# Patient Record
Sex: Female | Born: 1950 | Race: White | Hispanic: No | Smoking: Never smoker
Health system: Southern US, Community
[De-identification: ages and names within clinical notes are randomized; demographics above are authoritative.]

## PROBLEM LIST (undated history)

## (undated) DIAGNOSIS — K852 Alcohol induced acute pancreatitis without necrosis or infection: Secondary | ICD-10-CM

## (undated) DIAGNOSIS — R569 Unspecified convulsions: Secondary | ICD-10-CM

## (undated) DIAGNOSIS — I7300 Raynaud's syndrome without gangrene: Secondary | ICD-10-CM

## (undated) DIAGNOSIS — F329 Major depressive disorder, single episode, unspecified: Secondary | ICD-10-CM

## (undated) DIAGNOSIS — B182 Chronic viral hepatitis C: Secondary | ICD-10-CM

## (undated) DIAGNOSIS — D649 Anemia, unspecified: Secondary | ICD-10-CM

## (undated) DIAGNOSIS — F1010 Alcohol abuse, uncomplicated: Secondary | ICD-10-CM

## (undated) DIAGNOSIS — Z87898 Personal history of other specified conditions: Secondary | ICD-10-CM

## (undated) DIAGNOSIS — E162 Hypoglycemia, unspecified: Secondary | ICD-10-CM

## (undated) DIAGNOSIS — E871 Hypo-osmolality and hyponatremia: Secondary | ICD-10-CM

## (undated) DIAGNOSIS — F32A Depression, unspecified: Secondary | ICD-10-CM

## (undated) HISTORY — DX: Unspecified convulsions: R56.9

## (undated) HISTORY — PX: TUBAL LIGATION: SHX77

## (undated) HISTORY — PX: RHINOPLASTY: SUR1284

## (undated) HISTORY — DX: Raynaud's syndrome without gangrene: I73.00

## (undated) HISTORY — DX: Chronic viral hepatitis C: B18.2

---

## 2008-10-19 ENCOUNTER — Emergency Department (HOSPITAL_COMMUNITY): Admission: RE | Admit: 2008-10-19 | Discharge: 2008-10-19 | Payer: Self-pay | Admitting: Emergency Medicine

## 2012-05-12 ENCOUNTER — Inpatient Hospital Stay (HOSPITAL_COMMUNITY)
Admission: EM | Admit: 2012-05-12 | Discharge: 2012-05-15 | DRG: 439 | Disposition: A | Discharge status: Home / Self Care | Payer: MEDICAID | Attending: Internal Medicine | Admitting: Internal Medicine

## 2012-05-12 ENCOUNTER — Encounter (HOSPITAL_COMMUNITY): Payer: Self-pay | Admitting: *Deleted

## 2012-05-12 ENCOUNTER — Emergency Department (HOSPITAL_COMMUNITY): Payer: Self-pay

## 2012-05-12 DIAGNOSIS — E876 Hypokalemia: Secondary | ICD-10-CM | POA: Diagnosis present

## 2012-05-12 DIAGNOSIS — R1013 Epigastric pain: Secondary | ICD-10-CM | POA: Diagnosis present

## 2012-05-12 DIAGNOSIS — Z87898 Personal history of other specified conditions: Secondary | ICD-10-CM

## 2012-05-12 DIAGNOSIS — R531 Weakness: Secondary | ICD-10-CM

## 2012-05-12 DIAGNOSIS — K859 Acute pancreatitis without necrosis or infection, unspecified: Principal | ICD-10-CM | POA: Diagnosis present

## 2012-05-12 DIAGNOSIS — F1020 Alcohol dependence, uncomplicated: Secondary | ICD-10-CM | POA: Diagnosis present

## 2012-05-12 DIAGNOSIS — K852 Alcohol induced acute pancreatitis without necrosis or infection: Secondary | ICD-10-CM | POA: Diagnosis present

## 2012-05-12 DIAGNOSIS — D649 Anemia, unspecified: Secondary | ICD-10-CM | POA: Diagnosis present

## 2012-05-12 DIAGNOSIS — K5289 Other specified noninfective gastroenteritis and colitis: Secondary | ICD-10-CM | POA: Diagnosis present

## 2012-05-12 DIAGNOSIS — D539 Nutritional anemia, unspecified: Secondary | ICD-10-CM | POA: Diagnosis present

## 2012-05-12 DIAGNOSIS — F1010 Alcohol abuse, uncomplicated: Secondary | ICD-10-CM | POA: Diagnosis present

## 2012-05-12 DIAGNOSIS — R569 Unspecified convulsions: Secondary | ICD-10-CM | POA: Diagnosis present

## 2012-05-12 DIAGNOSIS — J329 Chronic sinusitis, unspecified: Secondary | ICD-10-CM | POA: Diagnosis present

## 2012-05-12 DIAGNOSIS — K529 Noninfective gastroenteritis and colitis, unspecified: Secondary | ICD-10-CM

## 2012-05-12 DIAGNOSIS — F3289 Other specified depressive episodes: Secondary | ICD-10-CM | POA: Diagnosis present

## 2012-05-12 DIAGNOSIS — Z8669 Personal history of other diseases of the nervous system and sense organs: Secondary | ICD-10-CM

## 2012-05-12 DIAGNOSIS — R112 Nausea with vomiting, unspecified: Secondary | ICD-10-CM | POA: Diagnosis present

## 2012-05-12 DIAGNOSIS — E871 Hypo-osmolality and hyponatremia: Secondary | ICD-10-CM | POA: Diagnosis present

## 2012-05-12 DIAGNOSIS — R197 Diarrhea, unspecified: Secondary | ICD-10-CM | POA: Diagnosis present

## 2012-05-12 DIAGNOSIS — F329 Major depressive disorder, single episode, unspecified: Secondary | ICD-10-CM | POA: Diagnosis present

## 2012-05-12 DIAGNOSIS — Z8659 Personal history of other mental and behavioral disorders: Secondary | ICD-10-CM

## 2012-05-12 DIAGNOSIS — R627 Adult failure to thrive: Secondary | ICD-10-CM | POA: Diagnosis present

## 2012-05-12 HISTORY — DX: Major depressive disorder, single episode, unspecified: F32.9

## 2012-05-12 HISTORY — DX: Anemia, unspecified: D64.9

## 2012-05-12 HISTORY — DX: Alcohol abuse, uncomplicated: F10.10

## 2012-05-12 HISTORY — DX: Alcohol induced acute pancreatitis without necrosis or infection: K85.20

## 2012-05-12 HISTORY — DX: Hypoglycemia, unspecified: E16.2

## 2012-05-12 HISTORY — DX: Depression, unspecified: F32.A

## 2012-05-12 HISTORY — DX: Hypo-osmolality and hyponatremia: E87.1

## 2012-05-12 HISTORY — DX: Personal history of other specified conditions: Z87.898

## 2012-05-12 LAB — CBC
HCT: 34.6 % — ABNORMAL LOW (ref 36.0–46.0)
Hemoglobin: 12.2 g/dL (ref 12.0–15.0)
MCHC: 35.3 g/dL (ref 30.0–36.0)
RBC: 3.51 MIL/uL — ABNORMAL LOW (ref 3.87–5.11)

## 2012-05-12 LAB — COMPREHENSIVE METABOLIC PANEL
ALT: 33 U/L (ref 0–35)
Alkaline Phosphatase: 73 U/L (ref 39–117)
BUN: 7 mg/dL (ref 6–23)
CO2: 33 mEq/L — ABNORMAL HIGH (ref 19–32)
GFR calc Af Amer: >90 mL/min (ref >90)
GFR calc non Af Amer: >90 mL/min (ref >90)
Glucose, Bld: 116 mg/dL — ABNORMAL HIGH (ref 70–99)
Potassium: 3.2 mEq/L — ABNORMAL LOW (ref 3.5–5.1)
Total Bilirubin: 0.5 mg/dL (ref 0.3–1.2)
Total Protein: 7.7 g/dL (ref 6.0–8.3)

## 2012-05-12 LAB — URINALYSIS, ROUTINE W REFLEX MICROSCOPIC
Bilirubin Urine: NEGATIVE
Glucose, UA: NEGATIVE mg/dL
Ketones, ur: NEGATIVE mg/dL
Specific Gravity, Urine: 1.01 (ref 1.005–1.030)
pH: 6 (ref 5.0–8.0)

## 2012-05-12 LAB — RAPID URINE DRUG SCREEN, HOSP PERFORMED
Benzodiazepines: NOT DETECTED
Cocaine: NOT DETECTED

## 2012-05-12 LAB — URINE MICROSCOPIC-ADD ON

## 2012-05-12 MED ORDER — SODIUM CHLORIDE 0.9 % IV SOLN: Freq: Once | INTRAVENOUS | Status: DC

## 2012-05-12 MED ORDER — SODIUM CHLORIDE 0.9 % IV BOLUS (SEPSIS)
1000.0000 mL | INTRAVENOUS | Status: AC
  Administered 2012-05-13: 1000 mL via INTRAVENOUS

## 2012-05-12 NOTE — ED Provider Notes (Signed)
History   This chart was scribed for Tobin Chad, MD by Toya Smothers. The patient was seen in room APA09/APA09. Patient's care was started at 2014.  CSN: [id number]  Arrival date & time 05/12/12  2014   First MD Initiated Contact with Patient 05/12/12 2134      Chief Complaint  Patient presents with  . Medical Clearance   The history is provided by the patient. No language interpreter was used.    Maria Petty is a 61 y.o. female w/ a h/o seizure ('03) who was brought to the Emergency Department by law enforcement after her house was deemed uninhabitable by adult protective services today. Typically healthy at baseline, Pt reports that she had a seizure 12 days ago after which she became bed ridden with nausea, vomiting, and diarrhea for 4 days. She denotes that she was unable to take care of 10 her dogs and they started to scavenge throughout the house. Law enforcement reports that floors were covered with feces and flee ridden. Pt presents asymptomatically, awake, alert, and oriented. She is currently unemployed. 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  Past Medical History  Diagnosis Date  . Hypoglycemia     Past Surgical History  Procedure Date  . Rhinoplasty   . Tubal ligation     History reviewed. No pertinent family history.  History  Substance Use Topics  . Smoking status: Never Smoker   . Smokeless tobacco: Not on file  . Alcohol Use: Yes   Review of Systems  Constitutional: Positive for activity change and appetite change. Negative for fever, chills, diaphoresis and fatigue.  HENT: Negative.   Eyes: Negative for visual disturbance.  Respiratory: Negative for apnea, cough, chest tightness, shortness of breath and wheezing.   Cardiovascular: Negative for chest pain, palpitations and leg swelling.  Gastrointestinal: Negative for vomiting, abdominal pain, diarrhea, constipation and abdominal  distention.  Genitourinary: Negative for dysuria, hematuria, flank pain, decreased urine volume and difficulty urinating.  Musculoskeletal: Negative for myalgias, back pain, joint swelling and arthralgias.  Skin: Positive for wound. Negative for color change and rash.  Neurological: Positive for seizures and weakness. Negative for dizziness, tremors, syncope, facial asymmetry, speech difficulty, light-headedness, numbness and headaches.  Psychiatric/Behavioral: Negative for suicidal ideas, hallucinations, confusion, disturbed wake/sleep cycle, self-injury, dysphoric mood, decreased concentration and agitation. The patient is not nervous/anxious.   All other systems reviewed and are negative.    Allergies  Review of patient's allergies indicates no known allergies.  Home Medications  No current outpatient prescriptions on file.  BP 147/90  Pulse 92  Temp 98.3 F (36.8 C) (Oral)  Resp 20  Ht 5\' 6"  (1.676 m)  Wt 122 lb 3 oz (55.424 kg)  BMI 19.72 kg/m2  SpO2 100%  Physical Exam  Nursing note and vitals reviewed. Constitutional: She is oriented to person, place, and time. She appears well-developed and well-nourished. No distress.       Pt appears thin but not cachectic.  HENT:  Head: Normocephalic.  Right Ear: External ear normal.  Left Ear: External ear normal.  Nose: Nose normal.  Mouth/Throat: Oropharynx is clear and moist. No oropharyngeal exudate.  Eyes: Conjunctivae normal and EOM are normal. Pupils are equal, round, and reactive to light. Right eye exhibits no discharge. Left eye exhibits no discharge. No scleral icterus.  Neck: Normal range of motion. Neck supple. No JVD present. No tracheal deviation present. No thyromegaly present.  Cardiovascular: Normal rate, regular rhythm, normal heart sounds and intact distal pulses.  Exam reveals no gallop and no friction rub.   No murmur heard. Pulmonary/Chest: Effort normal and breath sounds normal. No stridor. No respiratory  distress. She has no wheezes. She has no rales. She exhibits no tenderness.  Abdominal: Soft. Bowel sounds are normal. She exhibits no distension and no mass. There is no tenderness. There is no rebound and no guarding.  Musculoskeletal: Normal range of motion. She exhibits no edema and no tenderness.  Lymphadenopathy:    She has no cervical adenopathy.  Neurological: She is alert and oriented to person, place, and time. No cranial nerve deficit. Coordination normal.  Skin: Skin is warm and dry. No rash noted. She is not diaphoretic. No erythema. No pallor.       Large abrasion with well formed scab over right elbow.  Area is nontender and without erythema.  Multiple small abrasions noted on arms.  Psychiatric: She has a normal mood and affect. Her behavior is normal. Thought content normal. Her mood appears not anxious. Her affect is not angry, not blunt, not labile and not inappropriate. Her speech is not rapid and/or pressured, not delayed, not tangential and not slurred. She is not agitated, not aggressive, is not hyperactive, not slowed, not withdrawn, not actively hallucinating and not combative. Thought content is not paranoid and not delusional. Cognition and memory are normal. Cognition and memory are not impaired. She does not exhibit a depressed mood. She expresses no homicidal and no suicidal ideation. She expresses no suicidal plans and no homicidal plans. She is communicative. She exhibits normal recent memory and normal remote memory.       Pt is pleasant and interactive.  She demonstrates poor insight as to why she is in the ER. She is attentive.    ED Course  Procedures (including critical care time) DIAGNOSTIC STUDIES: Oxygen Saturation is 100% on room air, normal by my interpretation.    COORDINATION OF CARE: 21:53- Evaluated PT. Pt is awake, alert, and oriented.   Labs Reviewed  URINALYSIS, ROUTINE W REFLEX MICROSCOPIC - Abnormal; Notable for the following:    Leukocytes,  UA SMALL (*)     All other components within normal limits  CBC - Abnormal; Notable for the following:    RBC 3.51 (*)     HCT 34.6 (*)     MCH 34.8 (*)     All other components within normal limits  COMPREHENSIVE METABOLIC PANEL - Abnormal; Notable for the following:    Sodium 122 (*)     Potassium 3.2 (*)     Chloride 77 (*)     CO2 33 (*)     Glucose, Bld 116 (*)     All other components within normal limits  URINE RAPID DRUG SCREEN (HOSP PERFORMED)  URINE MICROSCOPIC-ADD ON   Ct Head Wo Contrast  05/12/2012  *RADIOLOGY REPORT*  Clinical Data: 61 year old female with altered level of consciousness and seizure.  CT HEAD WITHOUT CONTRAST  Technique:  Contiguous axial images were obtained from the base of the skull through the vertex without contrast.  Comparison: None  Findings: Mild chronic small vessel white matter ischemic changes are noted.  No acute intracranial abnormalities are identified, including mass lesion or mass effect, hydrocephalus, extra-axial fluid collection, midline shift, hemorrhage, or acute infarction.  The visualized bony calvarium is unremarkable. Opacified right to anterior ethmoid air cells and right frontal sinus noted. Mild mucosal thickening within the right maxillary  sinus identified.  IMPRESSION: No evidence of acute intracranial abnormality.  Mild chronic small vessel white matter ischemic changes.  Right paranasal sinusitis.   Original Report Authenticated By: Rosendo Gros, M.D.      No diagnosis found.   Date: 05/12/2012  Rate: 92 bpm  Rhythm: normal sinus rhythm  QRS Axis: left  Intervals: normal  ST/T Wave abnormalities: normal  Conduction Disutrbances:none  Narrative Interpretation: note both inf and septal Q waves as well as poor R progression.  Old EKG Reviewed: none available      MDM  Pt presents under IVC for evaluation after she was found in a home that appeared disheveled, full of unkempt and emaciated dogs, and insect infested.   She reports a recent illness that began with a seizure followed by several days of nausea and vomiting that prevented her from getting out of bed and tending to her animals (10 dogs).  She denies SI/HI but does not seem to have any insight as to why she is here in the ER or why her home is now not fit for habitation.  Plan routine labs, CT of head, and psych/soc service eval.    0000.  Note significant hyponatremia on BMP.  Pt denies any hx of same.  CT of head is negative for CVA or ICH but there is evidence of a right paranasal sinusitis.  Pt reports a long hx of sinus dx.  Secondary to hyponatremia, plan contact the hospitalist for further evaluation and observation admission.   I personally performed the services described in this documentation, which was scribed in my presence. The recorded information has been reviewed and considered.       Tobin Chad, MD 05/13/12 [phone]

## 2012-05-12 NOTE — ED Notes (Signed)
Patient transported to CT with sitter.

## 2012-05-12 NOTE — ED Notes (Signed)
Pt back from ct

## 2012-05-12 NOTE — ED Notes (Signed)
Here with deputy, in cuffs with IVC papers. Alert, cooperative,  Pt says she had been ill and her dogs mess up her house.  Papers say home is  Uninhabitable.  Pt alert , cooperative at triage.

## 2012-05-13 ENCOUNTER — Inpatient Hospital Stay (HOSPITAL_COMMUNITY): Payer: Self-pay

## 2012-05-13 ENCOUNTER — Encounter (HOSPITAL_COMMUNITY): Payer: Self-pay | Admitting: *Deleted

## 2012-05-13 DIAGNOSIS — R112 Nausea with vomiting, unspecified: Secondary | ICD-10-CM

## 2012-05-13 DIAGNOSIS — R1013 Epigastric pain: Secondary | ICD-10-CM

## 2012-05-13 DIAGNOSIS — K852 Alcohol induced acute pancreatitis without necrosis or infection: Secondary | ICD-10-CM

## 2012-05-13 DIAGNOSIS — R197 Diarrhea, unspecified: Secondary | ICD-10-CM

## 2012-05-13 DIAGNOSIS — R531 Weakness: Secondary | ICD-10-CM | POA: Diagnosis present

## 2012-05-13 DIAGNOSIS — F1010 Alcohol abuse, uncomplicated: Secondary | ICD-10-CM

## 2012-05-13 DIAGNOSIS — Z8659 Personal history of other mental and behavioral disorders: Secondary | ICD-10-CM

## 2012-05-13 DIAGNOSIS — E871 Hypo-osmolality and hyponatremia: Secondary | ICD-10-CM

## 2012-05-13 DIAGNOSIS — D649 Anemia, unspecified: Secondary | ICD-10-CM

## 2012-05-13 DIAGNOSIS — E876 Hypokalemia: Secondary | ICD-10-CM | POA: Diagnosis present

## 2012-05-13 DIAGNOSIS — Z87898 Personal history of other specified conditions: Secondary | ICD-10-CM

## 2012-05-13 DIAGNOSIS — K5289 Other specified noninfective gastroenteritis and colitis: Secondary | ICD-10-CM

## 2012-05-13 DIAGNOSIS — K529 Noninfective gastroenteritis and colitis, unspecified: Secondary | ICD-10-CM | POA: Diagnosis present

## 2012-05-13 DIAGNOSIS — R569 Unspecified convulsions: Secondary | ICD-10-CM

## 2012-05-13 HISTORY — DX: Alcohol induced acute pancreatitis without necrosis or infection: K85.20

## 2012-05-13 HISTORY — DX: Alcohol abuse, uncomplicated: F10.10

## 2012-05-13 HISTORY — DX: Anemia, unspecified: D64.9

## 2012-05-13 HISTORY — DX: Personal history of other specified conditions: Z87.898

## 2012-05-13 HISTORY — DX: Hypo-osmolality and hyponatremia: E87.1

## 2012-05-13 LAB — COMPREHENSIVE METABOLIC PANEL
BUN: 5 mg/dL — ABNORMAL LOW (ref 6–23)
CO2: 32 mEq/L (ref 19–32)
Calcium: 8.7 mg/dL (ref 8.4–10.5)
Creatinine, Ser: 0.48 mg/dL — ABNORMAL LOW (ref 0.50–1.10)
GFR calc Af Amer: >90 mL/min (ref >90)
GFR calc non Af Amer: >90 mL/min (ref >90)
Glucose, Bld: 142 mg/dL — ABNORMAL HIGH (ref 70–99)

## 2012-05-13 LAB — CBC
Hemoglobin: 11.4 g/dL — ABNORMAL LOW (ref 12.0–15.0)
MCHC: 35.4 g/dL (ref 30.0–36.0)
RBC: 3.27 MIL/uL — ABNORMAL LOW (ref 3.87–5.11)
WBC: 4.8 10*3/uL (ref 4.0–10.5)

## 2012-05-13 LAB — ETHANOL: Alcohol, Ethyl (B): <11 mg/dL (ref 0–11)

## 2012-05-13 LAB — TSH: TSH: 1.162 u[IU]/mL (ref 0.350–4.500)

## 2012-05-13 LAB — MAGNESIUM: Magnesium: 1.6 mg/dL (ref 1.5–2.5)

## 2012-05-13 LAB — VITAMIN B12: Vitamin B-12: 656 pg/mL (ref 211–911)

## 2012-05-13 LAB — LIPASE, BLOOD
Lipase: 180 U/L — ABNORMAL HIGH (ref 11–59)
Lipase: 161 U/L — ABNORMAL HIGH (ref 11–59)

## 2012-05-13 MED ORDER — SODIUM CHLORIDE 0.9 % IV SOLN: INTRAVENOUS | Status: DC

## 2012-05-13 MED ORDER — POTASSIUM CHLORIDE IN NACL 40-0.9 MEQ/L-% IV SOLN
INTRAVENOUS | Status: DC
  Administered 2012-05-13: 125 mL/h via INTRAVENOUS
  Administered 2012-05-13: 11:00:00 via INTRAVENOUS
  Administered 2012-05-14: 125 mL/h via INTRAVENOUS
  Filled 2012-05-13 (×9): qty 1000

## 2012-05-13 MED ORDER — LORAZEPAM 1 MG PO TABS
1.0000 mg | ORAL_TABLET | Freq: Four times a day (QID) | ORAL | Status: DC | PRN
  Administered 2012-05-13 – 2012-05-14 (×2): 1 mg via ORAL
  Filled 2012-05-13 (×2): qty 1

## 2012-05-13 MED ORDER — ADULT MULTIVITAMIN W/MINERALS CH
1.0000 | ORAL_TABLET | Freq: Every day | ORAL | Status: DC
  Administered 2012-05-13 – 2012-05-15 (×3): 1 via ORAL
  Filled 2012-05-13 (×3): qty 1

## 2012-05-13 MED ORDER — SODIUM CHLORIDE 0.9 % IV SOLN
INTRAVENOUS | Status: DC
  Administered 2012-05-13: 03:00:00 via INTRAVENOUS
  Filled 2012-05-13 (×2): qty 1000

## 2012-05-13 MED ORDER — POTASSIUM CHLORIDE IN NACL 20-0.9 MEQ/L-% IV SOLN
INTRAVENOUS | Status: DC
  Administered 2012-05-13: 11:00:00 via INTRAVENOUS

## 2012-05-13 MED ORDER — POTASSIUM CHLORIDE CRYS ER 20 MEQ PO TBCR
20.0000 meq | EXTENDED_RELEASE_TABLET | Freq: Two times a day (BID) | ORAL | Status: AC
  Administered 2012-05-13 (×2): 20 meq via ORAL
  Filled 2012-05-13 (×2): qty 1

## 2012-05-13 MED ORDER — FOLIC ACID 1 MG PO TABS
1.0000 mg | ORAL_TABLET | Freq: Every day | ORAL | Status: DC
  Administered 2012-05-13 – 2012-05-15 (×3): 1 mg via ORAL
  Filled 2012-05-13 (×3): qty 1

## 2012-05-13 MED ORDER — ONDANSETRON HCL 4 MG PO TABS: 4.0000 mg | ORAL_TABLET | Freq: Four times a day (QID) | ORAL | Status: DC | PRN

## 2012-05-13 MED ORDER — POTASSIUM CHLORIDE CRYS ER 20 MEQ PO TBCR
40.0000 meq | EXTENDED_RELEASE_TABLET | Freq: Once | ORAL | Status: AC
  Administered 2012-05-13: 40 meq via ORAL
  Filled 2012-05-13: qty 2

## 2012-05-13 MED ORDER — MAGNESIUM SULFATE 40 MG/ML IJ SOLN
1.0000 g | Freq: Once | INTRAMUSCULAR | Status: AC
  Administered 2012-05-13: 1 g via INTRAVENOUS
  Filled 2012-05-13: qty 50

## 2012-05-13 MED ORDER — THIAMINE HCL 100 MG/ML IJ SOLN: 100.0000 mg | Freq: Every day | INTRAMUSCULAR | Status: DC

## 2012-05-13 MED ORDER — ONDANSETRON HCL 4 MG/2ML IJ SOLN: 4.0000 mg | Freq: Four times a day (QID) | INTRAMUSCULAR | Status: DC | PRN

## 2012-05-13 MED ORDER — LEVETIRACETAM 500 MG PO TABS
250.0000 mg | ORAL_TABLET | Freq: Two times a day (BID) | ORAL | Status: DC
  Administered 2012-05-13 – 2012-05-14 (×4): 250 mg via ORAL
  Filled 2012-05-13: qty 2
  Filled 2012-05-13 (×4): qty 1

## 2012-05-13 MED ORDER — PANTOPRAZOLE SODIUM 40 MG IV SOLR
40.0000 mg | INTRAVENOUS | Status: DC
  Administered 2012-05-13 – 2012-05-15 (×3): 40 mg via INTRAVENOUS
  Filled 2012-05-13 (×3): qty 40

## 2012-05-13 MED ORDER — SODIUM CHLORIDE 0.9 % IJ SOLN
3.0000 mL | Freq: Two times a day (BID) | INTRAMUSCULAR | Status: DC
  Administered 2012-05-13 – 2012-05-15 (×4): 3 mL via INTRAVENOUS
  Filled 2012-05-13: qty 3

## 2012-05-13 MED ORDER — MAGNESIUM SULFATE IN D5W 10-5 MG/ML-% IV SOLN: 1.0000 g | Freq: Once | INTRAVENOUS | Status: DC

## 2012-05-13 MED ORDER — VITAMIN B-1 100 MG PO TABS
100.0000 mg | ORAL_TABLET | Freq: Every day | ORAL | Status: DC
  Administered 2012-05-13 – 2012-05-15 (×3): 100 mg via ORAL
  Filled 2012-05-13 (×3): qty 1

## 2012-05-13 MED ORDER — LORAZEPAM 2 MG/ML IJ SOLN: 1.0000 mg | Freq: Four times a day (QID) | INTRAMUSCULAR | Status: DC | PRN

## 2012-05-13 MED ORDER — MAGNESIUM SULFATE 50 % IJ SOLN: 1.0000 g | Freq: Once | INTRAVENOUS | Status: DC

## 2012-05-13 MED ORDER — ALUM & MAG HYDROXIDE-SIMETH 200-200-20 MG/5ML PO SUSP: 30.0000 mL | Freq: Four times a day (QID) | ORAL | Status: DC | PRN

## 2012-05-13 NOTE — ED Notes (Signed)
Pt has abrasions to bilateral elbows and bruise to face and arms. Pt states she does drink about 6 beers a night.

## 2012-05-13 NOTE — ED Notes (Signed)
Pt given green beans and mashed potatos

## 2012-05-13 NOTE — H&P (Signed)
Chief Complaint:  Brought in for medical clearance  HPI: 61 year old female who is overall healthy has a history of depression over 20 years ago but no prior psychiatric hospitalizations in the past who was brought in by the [facility] Department and department of social services as involuntarily committed and to be medically evaluated. Patient states that over a week ago she had a seizure and a day or so after that she started having nausea vomiting and diarrhea for 4 days. She became very weak and was basically unable to fully take care of herself. She has a total of 10 dogs in her house. 6 of the dogs are hers personally and 4 of them or adoption dogs. She says that during the time she was sick she was unable to feed or take the dogs out to the bathroom. A couple of days ago someone came to adopt one of the dogs and her house was a total mess animal feces all over her house along with torn furniture. Somehow someone called the [facility] Department who arrived at her home found it in total disarray and in adult protective services was called. At which point she was brought to the emergency department. Patient does have a history of having one seizure over 10 years ago and has had none since. She was never formally diagnosed with any seizure disorder. She denies any fevers. She says the nausea vomiting and diarrhea has totally resolved. She's not totally feeling back to normal but she is much better than she was 4 days ago. She has been having some epigastric discomfort which she states is from all the retching she was doing. She denies there being any blood in her vomit or diarrhea over the last week. She denies wanting to hurt herself in any way or anyone else. She denies any auditory or visual hallucinations.  she denies  having any recent issues with feeling down or depressed.  Review of Systems:  Otherwise negative  Past Medical History: Past Medical History  Diagnosis Date  . Hypoglycemia   .  Depression    Past Surgical History  Procedure Date  . Rhinoplasty   . Tubal ligation     Medications: Prior to Admission medications   Not on File    Allergies:  No Known Allergies  Social History:  reports that she has never smoked. She does not have any smokeless tobacco history on file. She reports that she drinks about 3.6 ounces of alcohol per week. She reports that she does not use illicit drugs.  Family History: History reviewed. No pertinent family history.  Physical Exam: Filed Vitals:   05/12/12 2224 05/12/12 2225 05/13/12 0156 05/13/12 0210  BP: 141/90 122/72 115/67   Pulse: 96 114 97   Temp:   99.3 F (37.4 C)   TempSrc:   Oral   Resp:   18   Height:    5\' 6"  (1.676 m)  Weight:    55.3 kg (121 lb 14.6 oz)  SpO2:   100%    General appearance: alert, cooperative and no distress Lungs: clear to auscultation bilaterally Heart: regular rate and rhythm, S1, S2 normal, no murmur, click, rub or gallop Abdomen: soft, non-tender; bowel sounds normal; no masses,  no organomegaly Extremities: extremities normal, atraumatic, no cyanosis or edema Pulses: 2+ and symmetric Skin: Skin color, texture, turgor normal. No rashes or lesions Neurologic: Grossly normal Psych:  Normal mood and affect.  No evidence of psychosis during interview    Labs on Admission:  Basename 05/12/12 2219  NA 122*  K 3.2*  CL 77*  CO2 33*  GLUCOSE 116*  BUN 7  CREATININE 0.54  CALCIUM 10.2  MG --  PHOS --    Basename 05/12/12 2219  AST 37  ALT 33  ALKPHOS 73  BILITOT 0.5  PROT 7.7  ALBUMIN 3.9    Basename 05/12/12 2219  LIPASE 180*  AMYLASE --    Basename 05/12/12 2219  WBC 7.3  NEUTROABS --  HGB 12.2  HCT 34.6*  MCV 98.6  PLT 268   Radiological Exams on Admission: Ct Head Wo Contrast  05/12/2012  *RADIOLOGY REPORT*  Clinical Data: 61 year old female with altered level of consciousness and seizure.  CT HEAD WITHOUT CONTRAST  Technique:  Contiguous axial  images were obtained from the base of the skull through the vertex without contrast.  Comparison: None  Findings: Mild chronic small vessel white matter ischemic changes are noted.  No acute intracranial abnormalities are identified, including mass lesion or mass effect, hydrocephalus, extra-axial fluid collection, midline shift, hemorrhage, or acute infarction.  The visualized bony calvarium is unremarkable. Opacified right to anterior ethmoid air cells and right frontal sinus noted. Mild mucosal thickening within the right maxillary sinus identified.  IMPRESSION: No evidence of acute intracranial abnormality.  Mild chronic small vessel white matter ischemic changes.  Right paranasal sinusitis.   Original Report Authenticated By: Rosendo Gros, M.D.     Assessment/Plan Present on Admission:  61 yo female brought to ED under involuntary commitment for reported bizaare behavior found to have hyponatremia and probable acute pancreatitis .Hyponatremia .Gastroenteritis, acute .Nausea & vomiting .Diarrhea .Abdominal pain, epigastric .Seizure  Tele psych consult has already been placed and pending.  Not clear if she had a bout of viral gastroenteritis (been seeing a lot in the community recently) and was febrile before the n/v/d started which lowered her threshold for seizure or if the seizure was a result of really a primary seizure disorder.  Her na level is low.  Will ck cxr.  No fever now.  lfts nml and lipase slightly elevated so ???acute pancreatitis.  Some etoh use.  Ck etoh level.  uds neg.  Ua clean.  Ck trig levels.  Place on sz precautions, place on NS ivf overnight.  Will order EEG.  Will need neuro eval at some point since this is her second seizure (last one over 10 years ago).  invol commitment papers still active until full psych eval completed.  Her n/v/d is now resolved.     Kolbi Tofte A [phone] 05/13/2012, 2:39 AM

## 2012-05-13 NOTE — Progress Notes (Signed)
The patient is a 61 year old woman who was admitted overnight for medical clearance. Dr. Kermit Balo history and physical was reviewed. The telemetry-neurologist assessment was reviewed. Per his assessment, the patient has alcohol abuse but is not suicidal or homicidal. The patient was briefly seen and evaluated. She had knowledge is drinking 6 light beers daily. She denies having alcohol withdrawal seizures or withdrawal symptoms. She also acknowledges having a seizure more than one week ago which had not occurred in the past 10 years. She had knowledge is having epigastric abdominal pain, and symptoms of a gastroenteritis. She is currently attempting to eat breakfast. She has no nausea, vomiting, diarrhea, but she does still have some epigastric discomfort.  Her followup lipase decreased from 180-161. Her that LFTs are within normal limits. Her serum sodium improved from 122 to 127. Her serum potassium is low at 3.0. Her magnesium level is 1.6. Followup chest x-ray is pending. She shows no signs of tremulousness. She is alert and oriented x3. Her speech is clear.  We'll continue IV fluid hydration. Will empirically start the Ativan alcohol withdrawal protocol which includes starting vitamin therapy. We'll start Protonix. We'll start Keppra empirically, but will discuss with neurology on Monday. Will downgrading her diet to a clear liquid diet. We'll continue to replete her potassium in the IV fluids and orally as tolerated. Will give her 1 g of magnesium sulfate. We'll check a TSH, free T4, vitamin B12/folate levels. Chest x-ray pending. EEG pending.

## 2012-05-14 ENCOUNTER — Encounter (HOSPITAL_COMMUNITY): Payer: Self-pay | Admitting: Internal Medicine

## 2012-05-14 DIAGNOSIS — F1010 Alcohol abuse, uncomplicated: Secondary | ICD-10-CM

## 2012-05-14 DIAGNOSIS — Z8669 Personal history of other diseases of the nervous system and sense organs: Secondary | ICD-10-CM

## 2012-05-14 DIAGNOSIS — K859 Acute pancreatitis without necrosis or infection, unspecified: Principal | ICD-10-CM

## 2012-05-14 LAB — CBC
HCT: 31.4 % — ABNORMAL LOW (ref 36.0–46.0)
MCHC: 34.7 g/dL (ref 30.0–36.0)
MCV: 100.3 fL — ABNORMAL HIGH (ref 78.0–100.0)
RDW: 12.4 % (ref 11.5–15.5)

## 2012-05-14 LAB — BASIC METABOLIC PANEL
BUN: <3 mg/dL — ABNORMAL LOW (ref 6–23)
Calcium: 8.2 mg/dL — ABNORMAL LOW (ref 8.4–10.5)
Chloride: 98 mEq/L (ref 96–112)
Creatinine, Ser: 0.41 mg/dL — ABNORMAL LOW (ref 0.50–1.10)
GFR calc Af Amer: >90 mL/min (ref >90)

## 2012-05-14 MED ORDER — POTASSIUM CHLORIDE IN NACL 20-0.9 MEQ/L-% IV SOLN
INTRAVENOUS | Status: DC
  Administered 2012-05-14: 50 mL/h via INTRAVENOUS
  Administered 2012-05-15: 07:00:00 via INTRAVENOUS

## 2012-05-14 NOTE — Progress Notes (Signed)
Subjective: The patient feels better. She has less abdominal pain. She has had no nausea vomiting or diarrhea during the hospitalization. She denies feeling nervous or tremulous.  Objective: Vital signs in last 24 hours: Filed Vitals:   05/13/12 1447 05/13/12 1837 05/13/12 2229 05/14/12 0531  BP: 132/81 121/82 123/82 109/74  Pulse: 84 93 82 84  Temp: 98.3 F (36.8 C) 98.4 F (36.9 C) 98.4 F (36.9 C) 98.4 F (36.9 C)  TempSrc: Oral Oral Oral Oral  Resp: 20 20 18 16   Height:      Weight:    58.3 kg (128 lb 8.5 oz)  SpO2: 100% 100% 100% 98%    Intake/Output Summary (Last 24 hours) at 05/14/12 0951 Last data filed at 05/13/12 1700  Gross per 24 hour  Intake    600 ml  Output      0 ml  Net    600 ml    Weight change: 2.876 kg (6 lb 5.5 oz)  Physical exam: General: Pleasant 61 year old Caucasian woman sitting up in bed, in no acute distress. Lungs: Clear to auscultation bilaterally. Heart: S1, S2, with no murmurs rubs or gallops. Abdomen: Positive bowel sounds, very minimal tenderness in the epigastrium, no rebound, no guarding, no masses palpated. Extremities: No pedal edema. Psychiatric/neurologic: She has a pleasant affect. She is alert and oriented x3. Speech is clear. No signs of tremulousness. Cranial nerves II through XII are intact.  Lab Results: Basic Metabolic Panel:  Basename 05/14/12 0442 05/13/12 0930 05/13/12 0441  NA 128* -- 127*  K 4.8 -- 3.0*  CL 98 -- 86*  CO2 25 -- 32  GLUCOSE 104* -- 142*  BUN <3* -- 5*  CREATININE 0.41* -- 0.48*  CALCIUM 8.2* -- 8.7  MG -- 1.6 --  PHOS -- -- --   Liver Function Tests:  [facility] 05/13/12 0441 05/12/12 2219  AST 29 37  ALT 26 33  ALKPHOS 61 73  BILITOT 0.4 0.5  PROT 6.3 7.7  ALBUMIN 3.1* 3.9    Basename 05/14/12 0800 05/13/12 0930  LIPASE 100* 161*  AMYLASE -- --   No results found for this basename: AMMONIA:2 in the last 72 hours CBC:  Basename 05/14/12 0442 05/13/12 0441  WBC 3.9* 4.8    NEUTROABS -- --  HGB 10.9* 11.4*  HCT 31.4* 32.2*  MCV 100.3* 98.5  PLT 267 247   Cardiac Enzymes: No results found for this basename: CKTOTAL:3,CKMB:3,CKMBINDEX:3,TROPONINI:3 in the last 72 hours BNP: No results found for this basename: PROBNP:3 in the last 72 hours D-Dimer: No results found for this basename: DDIMER:2 in the last 72 hours CBG: No results found for this basename: GLUCAP:6 in the last 72 hours Hemoglobin A1C: No results found for this basename: HGBA1C in the last 72 hours Fasting Lipid Panel:  Basename 05/13/12 0441  CHOL --  HDL --  LDLCALC --  TRIG 37  CHOLHDL --  LDLDIRECT --   Thyroid Function Tests:  [facility] 05/12/12 2219  TSH 1.162  T4TOTAL --  FREET4 1.54  T3FREE --  THYROIDAB --   Anemia Panel:  Basename 05/12/12 2219  VITAMINB12 656  FOLATE >20.0  FERRITIN --  TIBC --  IRON --  RETICCTPCT --   Coagulation: No results found for this basename: LABPROT:2,INR:2 in the last 72 hours Urine Drug Screen: Drugs of Abuse     Component Value Date/Time   LABOPIA NONE DETECTED 05/12/2012 2236   COCAINSCRNUR NONE DETECTED 05/12/2012 2236   LABBENZ NONE DETECTED 05/12/2012 2236  AMPHETMU NONE DETECTED 05/12/2012 2236   THCU NONE DETECTED 05/12/2012 2236   LABBARB NONE DETECTED 05/12/2012 2236    Alcohol Level:  Basename 05/13/12 0441  ETH <11   Urinalysis:  Basename 05/12/12 2236  COLORURINE YELLOW  LABSPEC 1.010  PHURINE 6.0  GLUCOSEU NEGATIVE  HGBUR NEGATIVE  BILIRUBINUR NEGATIVE  KETONESUR NEGATIVE  PROTEINUR NEGATIVE  UROBILINOGEN 0.2  NITRITE NEGATIVE  LEUKOCYTESUR SMALL*   Misc. Labs:   Micro: No results found for this or any previous visit (from the past 240 hour(s)).  Studies/Results: Dg Chest 2 View  05/13/2012  *RADIOLOGY REPORT*  Clinical Data: Nausea, vomiting, diarrhea  CHEST - 2 VIEW  Comparison: None.  Findings: Normal heart size.  Clear lungs.  No pneumothorax.  No pleural fluid.  Hyperaeration. Wedge  deformity at L1 with 10% loss of height anteriorly.  There is no cortical buckling or focal sclerosis. There is some irregularity of the superior endplate therefore this likely represents a compression deformity.  Age is indeterminate but it does have a chronic appearance.  IMPRESSION: Changes related to COPD.  No active cardiopulmonary disease.  Age indeterminate L1 superior endplate compression deformity.  It does have a chronic appearance.   Original Report Authenticated By: Donavan Burnet, M.D.    Ct Head Wo Contrast  05/12/2012  *RADIOLOGY REPORT*  Clinical Data: 61 year old female with altered level of consciousness and seizure.  CT HEAD WITHOUT CONTRAST  Technique:  Contiguous axial images were obtained from the base of the skull through the vertex without contrast.  Comparison: None  Findings: Mild chronic small vessel white matter ischemic changes are noted.  No acute intracranial abnormalities are identified, including mass lesion or mass effect, hydrocephalus, extra-axial fluid collection, midline shift, hemorrhage, or acute infarction.  The visualized bony calvarium is unremarkable. Opacified right to anterior ethmoid air cells and right frontal sinus noted. Mild mucosal thickening within the right maxillary sinus identified.  IMPRESSION: No evidence of acute intracranial abnormality.  Mild chronic small vessel white matter ischemic changes.  Right paranasal sinusitis.   Original Report Authenticated By: Rosendo Gros, M.D.     Medications:  Scheduled:   . folic acid  1 mg Oral Daily  . levETIRAcetam  250 mg Oral BID  . magnesium sulfate  1 g Intravenous Once  . multivitamin with minerals  1 tablet Oral Daily  . pantoprazole (PROTONIX) IV  40 mg Intravenous Q24H  . potassium chloride  20 mEq Oral BID  . sodium chloride  3 mL Intravenous Q12H  . thiamine  100 mg Oral Daily   Or  . thiamine  100 mg Intravenous Daily  . DISCONTD: magnesium sulfate LVP 250-500 ml  1 g Intravenous Once  .  DISCONTD: magnesium sulfate 1 - 4 g bolus IVPB  1 g Intravenous Once   Continuous:   . 0.9 % NaCl with KCl 20 mEq / L 50 mL/hr (05/14/12 0848)  . DISCONTD: 0.9 % NaCl with KCl 20 mEq / L 125 mL/hr at 05/13/12 1043  . DISCONTD: 0.9 % NaCl with KCl 40 mEq / L 125 mL/hr (05/14/12 0510)   YQM:VHQI & mag hydroxide-simeth, LORazepam, LORazepam, ondansetron (ZOFRAN) IV, ondansetron  Assessment: Active Problems:  Acute alcoholic pancreatitis  Generalized weakness  Hyponatremia  Gastroenteritis, acute  Nausea & vomiting  Diarrhea  Abdominal pain, epigastric  H/O: depression  History of seizures  Hypokalemia  Macrocytic Anemia  Alcohol abuse    1. Generalized weakness and failure to thrive at home. Her  symptomatology was likely secondary to acute pancreatitis and concomitant nausea/vomiting/abdominal pain/and short-lived diarrhea. According to the patient, she had been not feeling well for several days. She acknowledges that she had been unable to take care for her pets sufficiently because she was just too sick. Instead of letting them go outside in harms way, she allowed him to stay in the house even though they urinated and stooled. She thought they would be safer in the home. As days went by, she realized that she was unable to care for them. She denies abusing them or neglecting them purposely. She is now somewhat relieved that her pets are in a better environment, so she hopes. It will allow her time to visit family. She admits that she had been feeling overwhelmed lately.   Alcohol abuse/alcoholism. The patient acknowledges alcohol abuse. She wants to stop drinking. She is not necessarily interested in Alcoholics Anonymous, but she is receptive to other services available to support her abstinence. We'll continue the Ativan withdrawal protocol and vitamin therapy. Of note, the patient shows no signs of alcohol withdrawal syndrome. She is currently competent, alert, and oriented.  History  of depression. The patient had been treated with Prozac in the past, but she recently stopped it about 3-4 months ago on her own. She denies suicidal ideation. The tele-psychiatrist evaluated her and also did not believe she was  homicidal or suicidal.  Acute pancreatitis secondary to alcohol abuse with concomitant abdominal pain, diarrhea, nausea, and vomiting. Clinically and symptomatically she has improved. Will continue supportive treatment and advance diet as tolerated. Her lipase is decreasing progressively.  Macrocytic anemia. Likely secondary to alcohol abuse. Her vitamin B12 level and folate levels are within normal limits. We'll continue supportive treatment, vitamin therapy, and Protonix.  Hyponatremia. Secondary to hypovolemia/volume depletion and possibly from history of beer intake. Improving on IV fluids.  Hypokalemia and borderline hypomagnesemia. Status post oral and IV potassium chloride supplementation and status post IV magnesium.  History of seizures. The patient does not believe she has alcohol withdrawal seizures as she really never stopped drinking until she became sick. However alcohol withdrawal seizure is a possibility. She had her last seizure 2 weeks ago and before then 10 years prior. I have started Keppra empirically. Neurology has been consulted and will await their recommendation.   Plan:  1. Decrease IV fluids and advance diet to full liquids. 2. We'll continue to monitor her lipase level and symptoms. 3. Discontinue seizure precautions. 4. Consult the social worker Re: assistance with alcohol abuse programs and living arrangement (it is not clear if the patient's home has been declared unsafe). 5. Increase activity level. 6. Neurology consult pending. EEG pending.    LOS: 2 days   Namon Villarin 05/14/2012, 9:51 AM

## 2012-05-15 ENCOUNTER — Inpatient Hospital Stay (HOSPITAL_COMMUNITY)
Admit: 2012-05-15 | Discharge: 2012-05-15 | Disposition: A | Discharge status: Home / Self Care | Payer: Self-pay | Attending: Family Medicine | Admitting: Family Medicine

## 2012-05-15 ENCOUNTER — Encounter (HOSPITAL_COMMUNITY): Payer: Self-pay | Admitting: Internal Medicine

## 2012-05-15 LAB — BASIC METABOLIC PANEL
CO2: 26 mEq/L (ref 19–32)
Calcium: 8.6 mg/dL (ref 8.4–10.5)
Creatinine, Ser: 0.55 mg/dL (ref 0.50–1.10)
Glucose, Bld: 98 mg/dL (ref 70–99)

## 2012-05-15 LAB — LIPASE, BLOOD: Lipase: 93 U/L — ABNORMAL HIGH (ref 11–59)

## 2012-05-15 MED ORDER — THIAMINE HCL 100 MG PO TABS: 100.0000 mg | ORAL_TABLET | Freq: Every day | ORAL | Status: DC

## 2012-05-15 MED ORDER — PANTOPRAZOLE SODIUM 40 MG PO TBEC: 40.0000 mg | DELAYED_RELEASE_TABLET | Freq: Every day | ORAL | Status: DC

## 2012-05-15 MED ORDER — FLUOXETINE HCL 10 MG PO CAPS
10.0000 mg | ORAL_CAPSULE | Freq: Every day | ORAL | Status: DC
  Administered 2012-05-15: 10 mg via ORAL
  Filled 2012-05-15 (×3): qty 1

## 2012-05-15 MED ORDER — ADULT MULTIVITAMIN W/MINERALS CH: 1.0000 | ORAL_TABLET | Freq: Every day | ORAL | Status: AC

## 2012-05-15 MED ORDER — FLUOXETINE HCL 10 MG PO CAPS: 10.0000 mg | ORAL_CAPSULE | Freq: Every day | ORAL | Status: DC

## 2012-05-15 NOTE — Consult Note (Signed)
[facility] Maigen Mozingo A. Gerilyn Pilgrim, MD     [email or web address]        SUBJECTIVE: The patient is a 61 year old white female who presents with what appears to be a seizure. The patient reports that she knew she had a seizure because she was taking a shower and passed out. She did wake up after several minutes and the father that she bit her tongue on the right side. The patient had a seizure event similar to this about 10 years ago. She has not had recurrent events since then until recently. There is no family history of seizures. There is no history of significant head injuries, stroke or CNS infections. The patient did present today with significant hyponatremia of 122. She did not seek medical attention after a seizure about 2 weeks ago. She also apparently did not seek medical attention when she had a seizure 10 years ago. It has been related to me that she has a significant history of alcoholism. She is consuming large volumes of alcohol daily. She may have stopped drinking for about 3 days prior to the seizure she had 2 weeks ago. Additionally, she may of had significant hyponatremia that time.  OBJECTIVE: GENERAL: This very pleasant thin lady who is in no acute distress.  HEENT: Retro-palatal space is fine. She still has some residual effect of the tongue bite on the right side. There is also lip trauma on the upper lip.   ABDOMEN: soft  EXTREMITIES: No edema. There is significant bruising involving the elbows bilaterally.   BACK: Unremarkable.  SKIN: Normal by inspection.    MENTAL STATUS: Alert and oriented. Speech, language and cognition are generally intact. Judgment and insight normal.   CRANIAL NERVES: Pupils are equal, round and reactive to light and accomodation; extra ocular movements are full, there is no significant nystagmus; visual fields are full; upper and lower facial muscles are normal in strength and symmetric, there is no flattening of the nasolabial folds; tongue  is midline; uvula is midline; shoulder elevation is normal.  MOTOR: Normal tone, bulk and strength; no pronator drift.  COORDINATION: Left finger to nose is normal, right finger to nose is normal, No rest tremor; no intention tremor; no postural tremor; no bradykinesia.  REFLEXES: Deep tendon reflexes are symmetrical and normal. Babinski reflexes are flexor bilaterally.   SENSATION: Normal to light touch, temperature, and pinprick.    ASSESSMENT/PLAN:  1. Likely although all withdrawal seizures no seizures in the setting of some marked hyponatremia. The patient does not meet criteria for epilepsy and therefore would not recommend that she be placed on long-term antiepileptic medications. EEG is recommended. Seizure precaution and driving restrictions are suggestive for the next 3 months unless EEG is abnormal.  Beryle Beams, MD Diplomate, American Board of Psychiatry and Neurology (Neurology).

## 2012-05-15 NOTE — Consult Note (Signed)
Reason for Consult: Referring Physician:  [name] is an 61 y.o. female.  HPI:   Past Medical History  Diagnosis Date  . Hypoglycemia   . Depression   . History of seizures 05/13/2012  . Alcohol abuse 05/13/2012  . Acute alcoholic pancreatitis 05/13/2012    Past Surgical History  Procedure Date  . Rhinoplasty   . Tubal ligation     History reviewed. No pertinent family history.  Social History:  reports that she has never smoked. She does not have any smokeless tobacco history on file. She reports that she drinks about 3.6 ounces of alcohol per week. She reports that she does not use illicit drugs.  Allergies: No Known Allergies  Medications:  No current facility-administered medications on file prior to encounter.   Current Outpatient Prescriptions on File Prior to Encounter  Medication Sig Dispense Refill  . FLUoxetine (PROZAC) 10 MG capsule Take 1 capsule (10 mg total) by mouth daily.  30 capsule  1  . pantoprazole (PROTONIX) 40 MG tablet Take 1 tablet (40 mg total) by mouth daily.  30 tablet  1    Scheduled Meds:   . DISCONTD: FLUoxetine  10 mg Oral Daily  . DISCONTD: folic acid  1 mg Oral Daily  . DISCONTD: multivitamin with minerals  1 tablet Oral Daily  . DISCONTD: pantoprazole (PROTONIX) IV  40 mg Intravenous Q24H  . DISCONTD: sodium chloride  3 mL Intravenous Q12H  . DISCONTD: thiamine  100 mg Intravenous Daily  . DISCONTD: thiamine  100 mg Oral Daily   Continuous Infusions:   . DISCONTD: 0.9 % NaCl with KCl 20 mEq / L 50 mL/hr at 05/15/12 0644   PRN Meds:.DISCONTD: alum & mag hydroxide-simeth, DISCONTD: LORazepam, DISCONTD: LORazepam, DISCONTD: ondansetron (ZOFRAN) IV, DISCONTD: ondansetron   Results for orders placed during the hospital encounter of 05/12/12 (from the past 48 hour(s))  BASIC METABOLIC PANEL     Status: Abnormal   Collection Time   05/14/12  4:42 AM      Component Value Range Comment   Sodium 128 (*) 135 - 145 mEq/L    Potassium  4.8  3.5 - 5.1 mEq/L    Chloride 98  96 - 112 mEq/L    CO2 25  19 - 32 mEq/L    Glucose, Bld 104 (*) 70 - 99 mg/dL    BUN <3 (*) 6 - 23 mg/dL RESULT REPEATED AND VERIFIED   Creatinine, Ser 0.41 (*) 0.50 - 1.10 mg/dL    Calcium 8.2 (*) 8.4 - 10.5 mg/dL    GFR calc non Af Amer >90  >90 mL/min    GFR calc Af Amer >90  >90 mL/min   CBC     Status: Abnormal   Collection Time   05/14/12  4:42 AM      Component Value Range Comment   WBC 3.9 (*) 4.0 - 10.5 K/uL    RBC 3.13 (*) 3.87 - 5.11 MIL/uL    Hemoglobin 10.9 (*) 12.0 - 15.0 g/dL    HCT 78.4 (*) 69.6 - 46.0 %    MCV 100.3 (*) 78.0 - 100.0 fL    MCH 34.8 (*) 26.0 - 34.0 pg    MCHC 34.7  30.0 - 36.0 g/dL    RDW 29.5  28.4 - 13.2 %    Platelets 267  150 - 400 K/uL   LIPASE, BLOOD     Status: Abnormal   Collection Time   05/14/12  8:00 AM  Component Value Range Comment   Lipase 100 (*) 11 - 59 U/L   LIPASE, BLOOD     Status: Abnormal   Collection Time   05/15/12  4:59 AM      Component Value Range Comment   Lipase 93 (*) 11 - 59 U/L   BASIC METABOLIC PANEL     Status: Abnormal   Collection Time   05/15/12  4:59 AM      Component Value Range Comment   Sodium 131 (*) 135 - 145 mEq/L    Potassium 4.6  3.5 - 5.1 mEq/L    Chloride 98  96 - 112 mEq/L    CO2 26  19 - 32 mEq/L    Glucose, Bld 98  70 - 99 mg/dL    BUN <3 (*) 6 - 23 mg/dL    Creatinine, Ser 7.84  0.50 - 1.10 mg/dL    Calcium 8.6  8.4 - 69.6 mg/dL    GFR calc non Af Amer >90  >90 mL/min    GFR calc Af Amer >90  >90 mL/min   MAGNESIUM     Status: Normal   Collection Time   05/15/12  4:59 AM      Component Value Range Comment   Magnesium 2.0  1.5 - 2.5 mg/dL     Dg Chest 2 View  2/95/2841  *RADIOLOGY REPORT*  Clinical Data: Nausea, vomiting, diarrhea  CHEST - 2 VIEW  Comparison: None.  Findings: Normal heart size.  Clear lungs.  No pneumothorax.  No pleural fluid.  Hyperaeration. Wedge deformity at L1 with 10% loss of height anteriorly.  There is no cortical  buckling or focal sclerosis. There is some irregularity of the superior endplate therefore this likely represents a compression deformity.  Age is indeterminate but it does have a chronic appearance.  IMPRESSION: Changes related to COPD.  No active cardiopulmonary disease.  Age indeterminate L1 superior endplate compression deformity.  It does have a chronic appearance.   Original Report Authenticated By: Donavan Burnet, M.D.     Review of Systems  Constitutional: Negative.   Eyes: Negative.   Respiratory: Negative.   Cardiovascular: Negative.   Gastrointestinal: Negative.   Genitourinary: Negative.   Musculoskeletal: Negative.   Skin: Negative.   Neurological: Positive for headaches.   Blood pressure 117/74, pulse 94, temperature 98.3 F (36.8 C), temperature source Oral, resp. rate 18, height 5\' 6"  (1.676 m), weight 58.3 kg (128 lb 8.5 oz), SpO2 98.00%. Physical Exam  Assessment/Plan: Also see dictation and orders.    [name] 05/15/2012, 9:58 AM

## 2012-05-15 NOTE — Care Management Note (Signed)
    Page 1 of 1   05/15/2012     3:07:55 PM   CARE MANAGEMENT NOTE 05/15/2012  Patient:  ISABELLY, CATLEDGE   Account Number:  [id number]  Date Initiated:  05/15/2012  Documentation initiated by:  Rosemary Holms  Subjective/Objective Assessment:   Pt admitted from home where she lives alone. ETOH abuse, Pancreatitis, Hyponatremia. CSW consulted to see if her home has been declared unfit.     Action/Plan:   Spoke to pt and she would like a SW to come see her for assistance with issues. She also needs a PCP and CM advised her that [facility] sees pts on a sliding scale basis and gave her the telephone number to call and get an appt.   Anticipated DC Date:  05/15/2012   Anticipated DC Plan:  HOME W HOME HEALTH SERVICES      DC Planning Services  CM consult      Choice offered to / List presented to:          [facility] arranged  HH-1 RN  HH-10 DISEASE MANAGEMENT  HH-6 SOCIAL WORKER      HH agency  [facility]   Status of service:  Completed, signed off Medicare Important Message given?   (If response is "NO", the following Medicare IM given date fields will be blank) Date Medicare IM given:   Date Additional Medicare IM given:    Discharge Disposition:  HOME W HOME HEALTH SERVICES  Per UR Regulation:    If discussed at Long Length of Stay Meetings, dates discussed:    Comments:  05/15/12 1430 Jaelynne Hockley Leanord Hawking RN BSN CM

## 2012-05-15 NOTE — Progress Notes (Addendum)
Pt discharged back home. Pt took a cab back to her residence. The pt was given discharge instructions and new prescriptions to start. Pt verbalized understanding of discharge instructions and scheduling all follow up appointments. Pt given information about pancreatitis. Orson Ape D 05/15/2012

## 2012-05-15 NOTE — Discharge Summary (Signed)
Physician Discharge Summary  Maria Petty JXB:[id number] DOB: [birth year] DOA: 05/12/2012  PCP: No primary provider on file.  Admit date: 05/12/2012 Discharge date: 05/15/2012  Recommendations for Outpatient Follow-up:  1. The patient was discharged to home in improved condition. She was advised to choose a primary care physician of her choice that her insurance will pay for. She will followup with her primary psychiatrist, Dr. Piedad Petty, in [place]. She will followup with a sponsor from Alcoholics Anonymous and other resources given to the patient for assistance with alcohol cessation. Home Health services with the social worker and our and were ordered.  Discharge Diagnoses:  1. Acute alcoholic pancreatitis. 2. Nausea, vomiting, epigastric abdominal pain and short-lived diarrhea, likely secondary to #1. 3. Alcohol abuse/alcoholism. (No signs of alcohol withdrawal syndrome). 4. Recent history of seizure; possibly secondary to hyponatremia versus alcohol withdrawal. 5. Macrocytic anemia, likely secondary to alcohol abuse. 6. Hypokalemia. Supplement and repleted. 7. Generalized weakness and failure to thrive, secondary to acute illness. 8. History of depression, but not suicidal.   Discharge Condition: Improved.  Diet recommendation: Vegetarian low fat.  Filed Weights   05/12/12 2106 05/13/12 0210 05/14/12 0531  Weight: 55.424 kg (122 lb 3 oz) 55.3 kg (121 lb 14.6 oz) 58.3 kg (128 lb 8.5 oz)    History of present illness:  The patient is a 61 year old woman with a history significant for depression, remote seizure, and alcohol abuse, who presented to the emergency department on 05/13/2012 with generalized weakness, nausea, vomiting, diarrhea, and abdominal pain. Apparently, the [facility] Department and Department of Social Services was called by an anonymous neighbor because the home appeared to be in double and there were many dogs. She was brought in for medical clearance and evaluation. 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 In the emergency, she was afebrile and hemodynamically stable, though mildly tachycardic at times. Her lab data were significant for a serum sodium of 122, potassium of 3.2, normal LFTs, and lipase of 180. CT of her head revealed mild sinusitis changes but no acute intracranial abnormality. Her EKG revealed normal sinus rhythm with poor R-wave progression. Her chest x-ray revealed changes of COPD but no acute process. Her urine drug screen was negative. Her alcohol level was less than 11. Her urinalysis was essentially unremarkable. She was admitted for further evaluation and management.  Hospital Course:  The tele-neurologist was consulted. He evaluated the patient. Per his assessment, the patient was neither suicidal nor homicidal. However, he recommended admission for alcohol detoxification. She was started on IV fluid hydration given that her serum creatinine was 122. Potassium chloride was added to the IV fluids. She was initially started on solid foods, but the diet was downgraded when it was believed that she had acute pancreatitis. She was also restarted on a clear liquid diet. Analgesics were ordered IV as needed. Antiemetics were ordered IV as needed. IV proton next was started empirically. The Ativan alcohol withdrawal protocol was started which included vitamin therapy. The patient later admitted that she was an alcoholic and she had been battling with alcohol abuse for years. She reported drinking a sixpack or little bit more daily. She denied any history of alcohol withdrawal seizures, DUIs/DWIs, or alcoholic related injuries. She stated that she had a seizure one to 2 weeks prior to the hospitalization, but she did not believe it was the cause of alcohol withdrawal because she had been still drinking, but not as much.  For further evaluation, a number of studies were ordered. Her triglyceride level was  37. Her folate was greater than 20. Her vitamin B12 level was 656. Her TSH and free T4 were within normal limits at 1.16 and 1.54 respectively. Her magnesium level was 1.6, borderline normal. She was given 1 g of magnesium sulfate intravenously. The followup magnesium improved to 2.0. Her hemoglobin fell to 10.9, although, there was no evidence of GI or GU bleeding. Her MCV also increased to 100, consistent with alcohol associated macrocytic anemia. Her lipase progressively improved to 93.  I discussed the patient's living environment with her. Apparently, she became sick and had been unable to take care of herself and her dogs/cats. She had no  family or friends close by to call or rely on to ask for assistance. She had never knowingly or purposely tried to abuse her animals. She had nursed many animals back to health herself. She Her pets in the house because she did not want them to run out into the street to get hit by a car. Rather, she allowed them to urinate and stool in the home. Even then, she thought it would be safer to keep them in the house. She had become overwhelmed lately. She is now somewhat relieved that her pets are in a better environment, so she hopes. It would allow her time to visit her family and to work on improving her lifestyle and to achieve sobriety. She denied any major depressive-like symptoms. She had been on Prozac in the past but stopped taking it about 3-4 months ago on her own. She has not been suicidal. She was lost to followup with her primary psychiatrist Dr. Piedad Petty.  The patient desires to stop drinking. Initially, she was not receptive to going to alcoholics anonymous, however, Child psychotherapist, Ms. Maria Petty was consulted and provided the patient with a sponsor from AA that would be an initial phone call contact. The patient was receptive to a phone call contact. Ms. Maria Petty also provided the patient with other resources that would assist her with sobriety. Later, the  patient was receptive to starting Prozac. It was started a slightly lower dose than what she had been taking in the past. The intention is for the Prozac to be titrated accordingly by her primary psychiatrist.  The patient was started empirically on Keppra for a reported seizure 2 weeks ago. Prior to this, she had not had a seizure in 10 years. Dr. Gerilyn Pilgrim was consulted. He believed that her seizure could have been secondary to hyponatremia and possibly alcohol withdrawal as the patient may not have been drinking as much. An EEG was ordered. The results were pending at the time of discharge. He recommended that the Keppra be discontinued. It was discontinued. The patient had no seizure activity during hospitalization.   The patient improved clinically. Her serum sodium improved to 131. Her potassium improved to 4.6. Her diet was advanced which she tolerated well. She demonstrated no signs or symptoms consistent with alcohol withdrawal syndrome. She was very pleasant, receptive, and cooperative during the hospitalization. Adult Protective Services apparently evaluated the patient's home and did not deem the home uninhabitable. She was discharged to home in improved and stable condition.   Procedures:  EEG, results pending.  Consultations:  Neurologist, Dr. Gerilyn Pilgrim  Discharge Exam: Filed Vitals:   05/14/12 2232 05/15/12 0504 05/15/12 0840 05/15/12 1353  BP: 135/76 148/95 117/74 112/80  Pulse: 89 71 94 105  Temp: 97.9 F (36.6 C) 98.3 F (  36.8 C)  99.5 F (37.5 C)  TempSrc: Oral Oral  Oral  Resp: 18 18  18   Height:      Weight:      SpO2: 99% 98%  100%    General: Pleasant 61 year old Caucasian woman sitting up in bed, in no acute distress. Cardiovascular: S1, S2, with no murmurs rubs or gallops. Respiratory: Clear to auscultation bilaterally. Neuro/psychiatric: She is alert and oriented x3. No signs of tremulousness. Her affect is pleasant.  Discharge Instructions  Discharge  Orders    Future Orders Please Complete By Expires   Diet general      Increase activity slowly      Discharge instructions      Comments:   Followup with your primary psychiatrist in one to 2 weeks. Followup with the resources that will assist you with alcohol abstinence.       Medication List     As of 05/15/2012  3:14 PM    TAKE these medications         FLUoxetine 10 MG capsule   Commonly known as: PROZAC   Take 1 capsule (10 mg total) by mouth daily.      multivitamin with minerals Tabs   Take 1 tablet by mouth daily.      pantoprazole 40 MG tablet   Commonly known as: PROTONIX   Take 1 tablet (40 mg total) by mouth daily.      thiamine 100 MG tablet   Take 1 tablet (100 mg total) by mouth daily.           Follow-up Information    Please follow up. (Followup with your psychiatrist in one to 2 weeks.)       Follow up with [facility]. (RN, SW. To call for appt to begin Weds/Thursday)    Contact information:   [address] [place] [address] [phone]          The results of significant diagnostics from this hospitalization (including imaging, microbiology, ancillary and laboratory) are listed below for reference.    Significant Diagnostic Studies: Dg Chest 2 View  05/13/2012  *RADIOLOGY REPORT*  Clinical Data: Nausea, vomiting, diarrhea  CHEST - 2 VIEW  Comparison: None.  Findings: Normal heart size.  Clear lungs.  No pneumothorax.  No pleural fluid.  Hyperaeration. Wedge deformity at L1 with 10% loss of height anteriorly.  There is no cortical buckling or focal sclerosis. There is some irregularity of the superior endplate therefore this likely represents a compression deformity.  Age is indeterminate but it does have a chronic appearance.  IMPRESSION: Changes related to COPD.  No active cardiopulmonary disease.  Age indeterminate L1 superior endplate compression deformity.  It does have a chronic appearance.   Original Report  Authenticated By: Donavan Burnet, M.D.    Ct Head Wo Contrast  05/12/2012  *RADIOLOGY REPORT*  Clinical Data: 61 year old female with altered level of consciousness and seizure.  CT HEAD WITHOUT CONTRAST  Technique:  Contiguous axial images were obtained from the base of the skull through the vertex without contrast.  Comparison: None  Findings: Mild chronic small vessel white matter ischemic changes are noted.  No acute intracranial abnormalities are identified, including mass lesion or mass effect, hydrocephalus, extra-axial fluid collection, midline shift, hemorrhage, or acute infarction.  The visualized bony calvarium is unremarkable. Opacified right to anterior ethmoid air cells and right frontal sinus noted. Mild mucosal thickening within the right maxillary sinus identified.  IMPRESSION: No evidence  of acute intracranial abnormality.  Mild chronic small vessel white matter ischemic changes.  Right paranasal sinusitis.   Original Report Authenticated By: Rosendo Gros, M.D.     Microbiology: No results found for this or any previous visit (from the past 240 hour(s)).   Labs: Basic Metabolic Panel:  Lab 05/15/12 5284 05/14/12 0442 05/13/12 0930 05/13/12 0441 05/12/12 2219  NA 131* 128* -- 127* 122*  K 4.6 4.8 -- 3.0* 3.2*  CL 98 98 -- 86* 77*  CO2 26 25 -- 32 33*  GLUCOSE 98 104* -- 142* 116*  BUN <3* <3* -- 5* 7  CREATININE 0.55 0.41* -- 0.48* 0.54  CALCIUM 8.6 8.2* -- 8.7 10.2  MG 2.0 -- 1.6 -- --  PHOS -- -- -- -- --   Liver Function Tests:  Lab 05/13/12 0441 05/12/12 2219  AST 29 37  ALT 26 33  ALKPHOS 61 73  BILITOT 0.4 0.5  PROT 6.3 7.7  ALBUMIN 3.1* 3.9    Lab 05/15/12 0459 05/14/12 0800 05/13/12 0930 05/12/12 2219  LIPASE 93* 100* 161* 180*  AMYLASE -- -- -- --   No results found for this basename: AMMONIA:5 in the last 168 hours CBC:  Lab 05/14/12 0442 05/13/12 0441 05/12/12 2219  WBC 3.9* 4.8 7.3  NEUTROABS -- -- --  HGB 10.9* 11.4* 12.2  HCT 31.4* 32.2*  34.6*  MCV 100.3* 98.5 98.6  PLT 267 247 268   Cardiac Enzymes: No results found for this basename: CKTOTAL:5,CKMB:5,CKMBINDEX:5,TROPONINI:5 in the last 168 hours BNP: BNP (last 3 results) No results found for this basename: PROBNP:3 in the last 8760 hours CBG: No results found for this basename: GLUCAP:5 in the last 168 hours  Time coordinating discharge: Greater than 30 minutes  Signed:  Leonette Tischer  [facility] 05/15/2012, 3:14 PM

## 2012-05-15 NOTE — Progress Notes (Signed)
EEG complete at bedside.

## 2012-05-15 NOTE — Progress Notes (Signed)
UR Chart Review Completed  

## 2012-05-15 NOTE — Clinical Social Work Psychosocial (Signed)
Clinical Social Work Department BRIEF PSYCHOSOCIAL ASSESSMENT 05/15/2012  Patient:  Maria Petty, Maria Petty     Account Number:  [id number]     Admit date:  05/12/2012  Clinical Social Worker:  Santa Genera, CLINICAL SOCIAL WORKER  Date/Time:  05/15/2012 10:30 AM  Referred by:  Physician  Date Referred:  05/15/2012 Referred for  Substance Abuse   Other Referral:   Concerned that house has been condemned.   Interview type:  Patient Other interview type:    PSYCHOSOCIAL DATA Living Status:  ALONE Admitted from facility:   Level of care:   Primary support name:  Lurline Idol Primary support relationship to patient:  SIBLING Degree of support available:   Brother lives in [place], mother has dementia, few friends and little contact w them    CURRENT CONCERNS Current Concerns  Substance Abuse   Other Concerns:   Potential housing issues    SOCIAL WORK ASSESSMENT / PLAN CSW met w patient at bedside.  Patient alert and oriented. Patient was found on Friday night at her home, had seizure 10 days ago, felt "puny"for a couple of days, then experienced illness that left her bed bound for several days.  During that time, she allowed her 10 dogs to be loose in house as she could not get out of bed to feed or let them outside.  Patient had visitor who wanted to adopt one of her dogs, visitor was concerned about her well being and called sheriff to investigate.  Animal control removed all 10 dogs, and sheriff called Adult Management consultant. APS did not become involved, but [facility] did come to do on-site assessment and recommended patient be IVCd to [facility].  Sheriiff transported patient to [facility] under IVC and patient was subsequently admitted.    Patient lives alone and has few friends, no one who checks on her regularly.  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  Patient receives approx $650/month from stock options, has brother in [place] who is supportive of her and can assist financially.  Mother is also in [facility] and has dementia.  Patient has worked in past, but for past 13 years has lived in [place] and rescued dogs.    Patient admits to a 6 light beers/day drinking habit, which she has done since age 14.  Patient says that her maximum is 6 beers/day, sometimes she drinks only 1 - 2.  Denies needing drink as eyeopener, blackouts or injuries to self/others as result of drinking.  Says that she has wanted to stop, but has  been concerned about DTs, so continued to drink.  Even while she was sick in bed, states that she "forced herself to drink a beer" so she would not go through withdrawal.  Patient resists AA because of perceived lack of [place], but was willing to speak w AA rep by phone and access online web resources.    Patient states that she has experienced "severe depression" approx age 18, around time of a marriage, saw"talk therapist" for one year, then was placed on Prozac by MD which helped her feel better in approx one month.  Sees Dr Lucrezia Starch in [place], private pay, twice/year and is sometimes on Prozac.  Has not seen Dr Piedad Climes recently so is not on ADPs at present.  Denies SI or HI or intent at moment,  however, is also coping w loss of dogs to animal control, house in need of significant clean up and perceived loss of respect by others in animal rescue community due to losing dogs she was rescuing to Furniture conservator/restorer.    Patient has neighbors, but is reluctant to ask any one for help as she is quite embarrassed by current situation.    Per [facility] Asch[facility] DSS/ APS supervisor, Adult Protective Services is not involved in case.  APS recommended referral to [facility] on Friday night when contacted as they felt this was more a mental health issue. Per [employer] and [employer],  house is not condemned and patient can return there if she wants.    CSW gave patient referrals for inperson and online AA contacts, encouraged patient to contact psychiatrist to be seen for med eval, encouraged patient to test strength of her social support network by asking for help w cleaning up house and regaining possession of her own animals if possible.   Assessment/plan status:  Psychosocial Support/Ongoing Assessment of Needs Other assessment/ plan:   Information/referral to community resources:   AA Insurance claims handler of AA meetings in [place]  Online AA website  Rethinking Drinking pamphlet    PATIENT'S/FAMILY'S RESPONSE TO PLAN OF CARE: Patient willing to explore treatment options for substance use and mental health concerns.    Santa Genera, LCSW Clinical Social Worker [phone])

## 2012-05-17 NOTE — Procedures (Signed)
[facility] Evagelia Knack A. Gerilyn Pilgrim, MD     [email or web address]         Maria Petty, Maria Petty                 ACCOUNT NO.:  [id number]  MEDICAL RECORD NO.:  [id number]  LOCATION:  A212                          FACILITY:  [facility]  PHYSICIAN:  Ronniesha Seibold A. Gerilyn Pilgrim, M.D. DATE OF BIRTH:  Nov 05, 1950  DATE OF PROCEDURE:  05/15/2012 DATE OF DISCHARGE:  05/15/2012                             EEG INTERPRETATION   INDICATION:  This is a 61 year old lady who presents after having a seizure characterized by loss of consciousness and tongue biting.  MEDICATION:  Keppra, Ativan, folic acid, Zofran, Protonix, thiamine.  ANALYSIS:  A 16-channel recording is conducted for 21 minutes using standard 10/20 measurements.  There is a well-formed low-voltage electrocortical activity of 15 Hz, which attenuates with eye opening. There is also a beta activity observed in the frontal areas.  Awake and drowsy activities are recorded.  Photic stimulation is carried out without abnormal changes in the background activity.  There is no focal or lateralized slowing.  There is no epileptiform activity observed.  IMPRESSION:  Normal recording of the awake and drowsy states.     Leontina Skidmore A. Gerilyn Pilgrim, M.D.     KAD/MEDQ  D:  05/16/2012  T:  05/17/2012  Job:  [id number]

## 2012-07-19 NOTE — Progress Notes (Signed)
UR chart review completed.  

## 2014-03-19 IMAGING — CT CT HEAD W/O CM
1 of 2 series · 16 of 30 positions shown, 20 images · non-contrast
Comparison: None

CLINICAL DATA: 61-year-old female with altered level of
consciousness and seizure.

CT HEAD WITHOUT CONTRAST
TECHNIQUE: Contiguous axial images were obtained from the base of
the skull through the vertex without contrast.

[Series 3: headtrauma 2.4 h60s · axial · 0.45mm/px · z∈[+1015,+1175]mm · 16 of 72 slices shown, 20 images]
[im 4/72  brain]
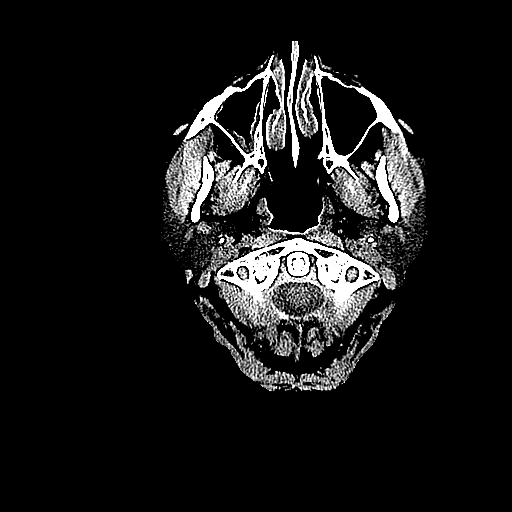
[im 4/72  bone]
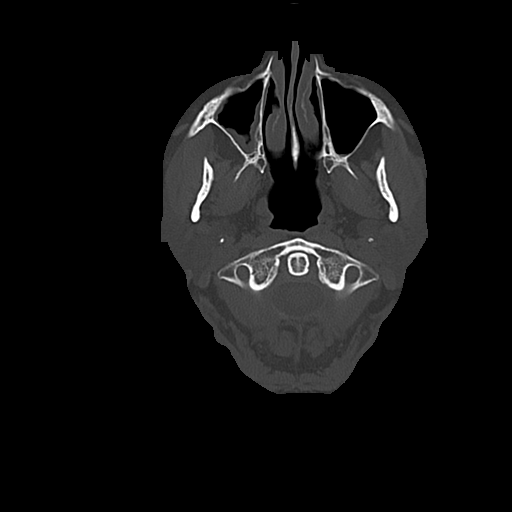
[im 8/72  brain]
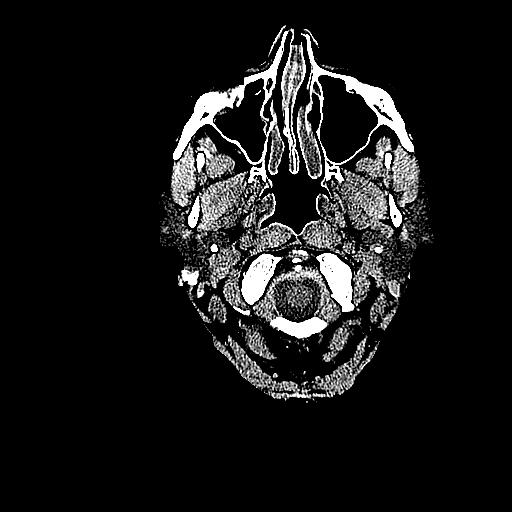
[im 12/72  brain]
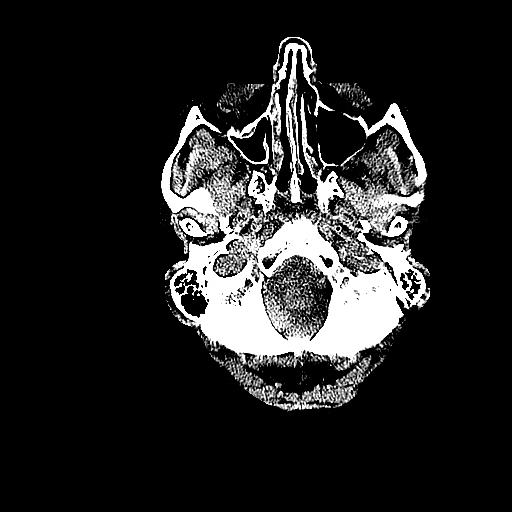
[im 15/72  brain]
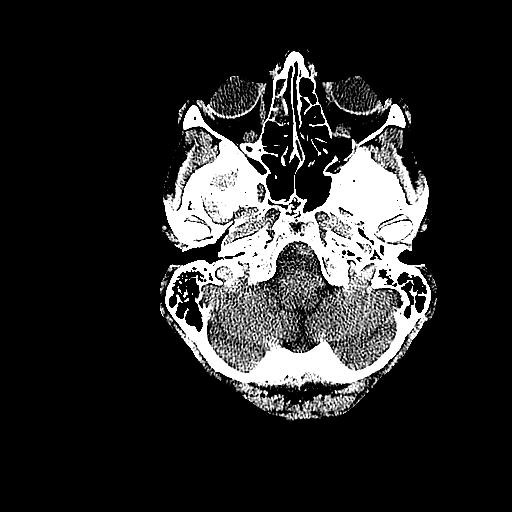
[im 23/72  brain]
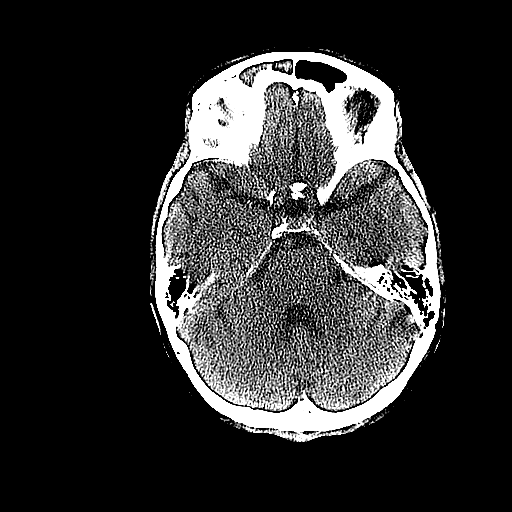
[im 23/72  bone]
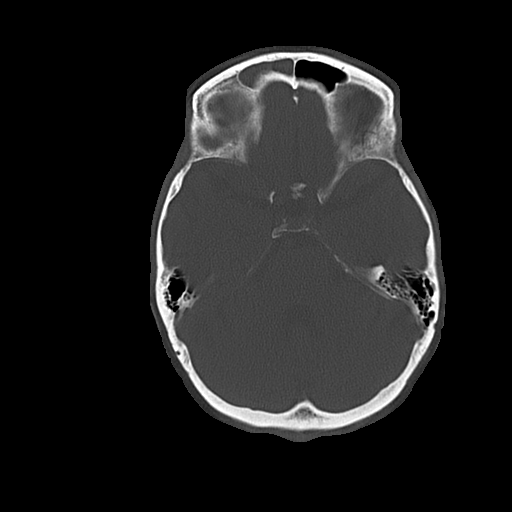
[im 27/72  brain]
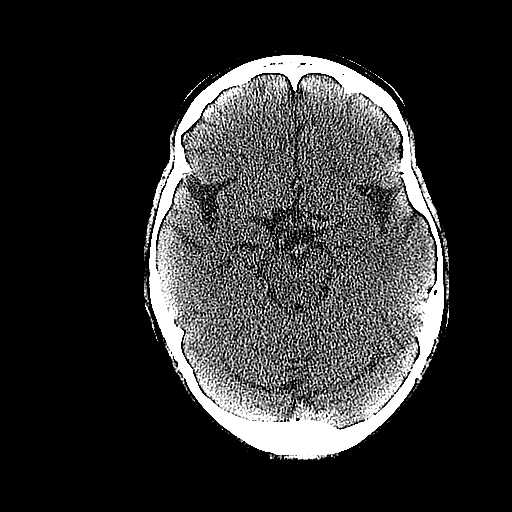
[im 30/72  brain]
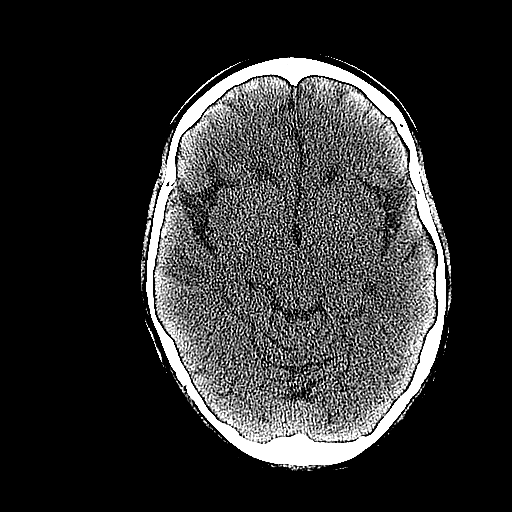
[im 34/72  brain]
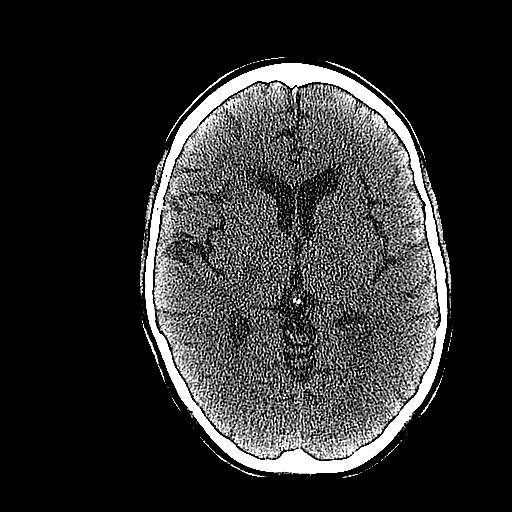
[im 38/72  brain]
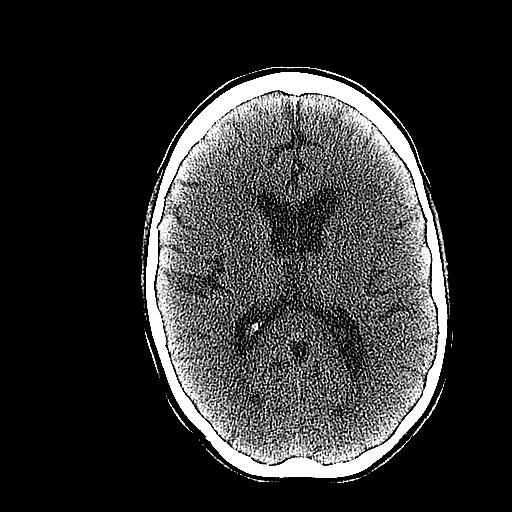
[im 38/72  bone]
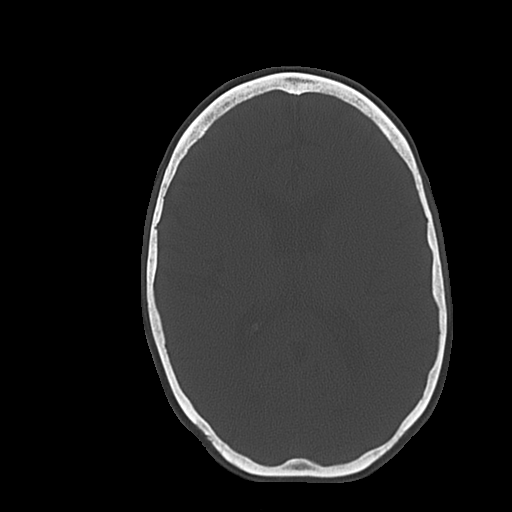
[im 42/72  brain]
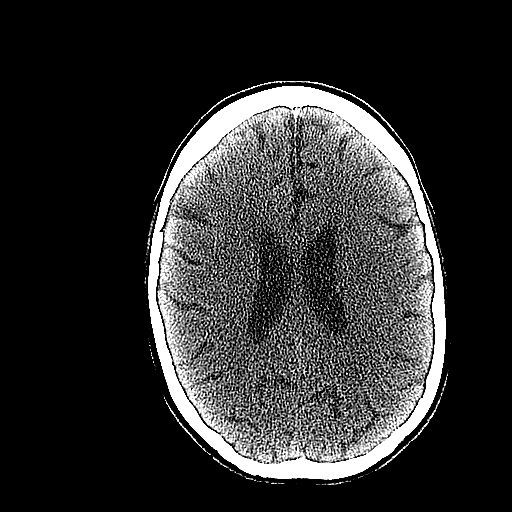
[im 45/72  brain]
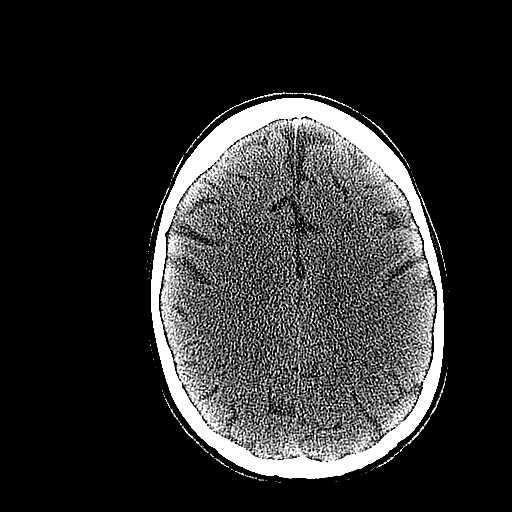
[im 49/72  brain]
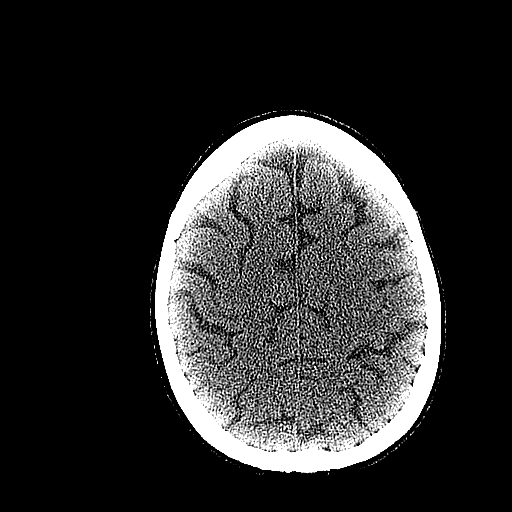
[im 57/72  brain]
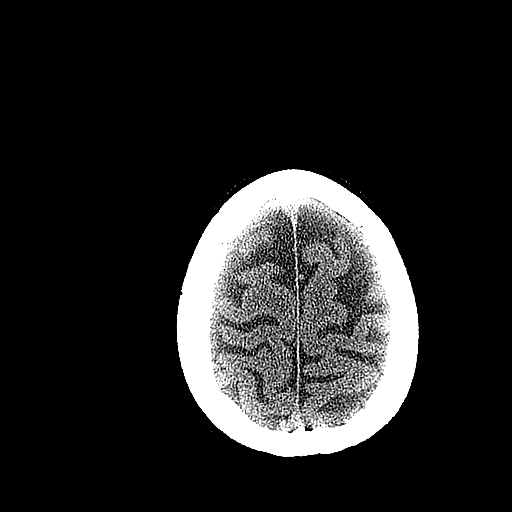
[im 57/72  bone]
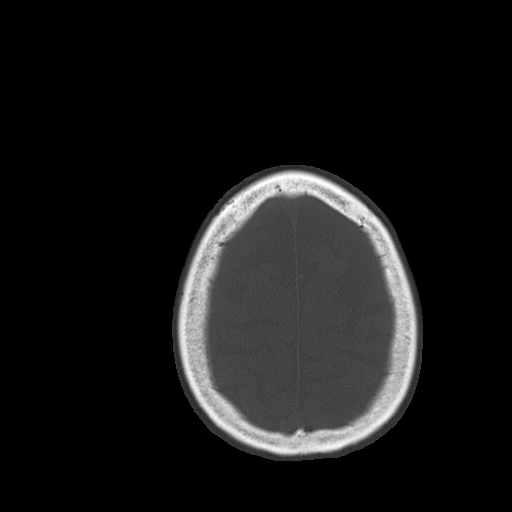
[im 60/72  brain]
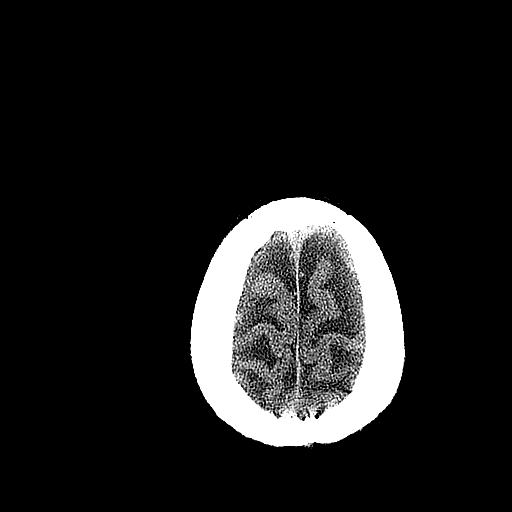
[im 64/72  brain]
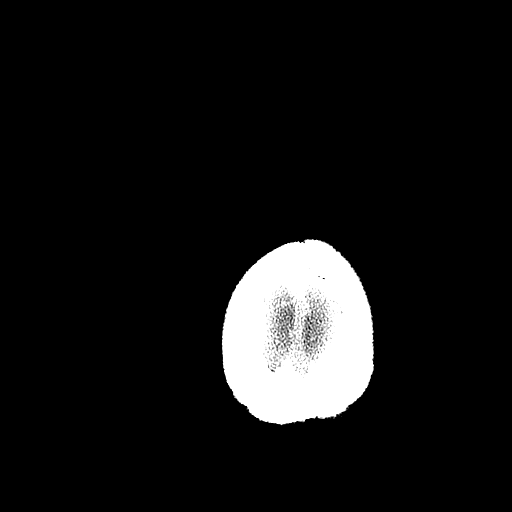
[im 68/72  brain]
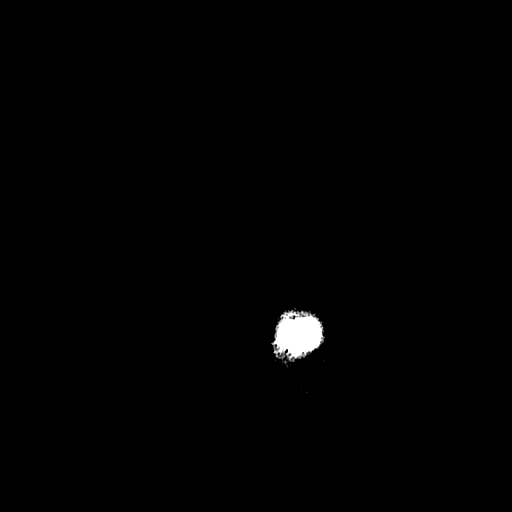

[16 of 30 positions shown; findings below may reference images not displayed]

FINDINGS: Mild chronic small vessel white matter ischemic changes
are noted.

No acute intracranial abnormalities are identified, including mass
lesion or mass effect, hydrocephalus, extra-axial fluid collection,
midline shift, hemorrhage, or acute infarction.

The visualized bony calvarium is unremarkable.
Opacified right to anterior ethmoid air cells and right frontal
sinus noted.
Mild mucosal thickening within the right maxillary sinus
identified.
IMPRESSION: No evidence of acute intracranial abnormality.

Mild chronic small vessel white matter ischemic changes.

Right paranasal sinusitis.

## 2014-10-31 ENCOUNTER — Ambulatory Visit (INDEPENDENT_AMBULATORY_CARE_PROVIDER_SITE_OTHER): Payer: Self-pay | Admitting: Psychiatry

## 2014-10-31 ENCOUNTER — Encounter (HOSPITAL_COMMUNITY): Payer: Self-pay | Admitting: Psychiatry

## 2014-10-31 VITALS — BP 181/100 | HR 77 | Ht 66.0 in | Wt 132.0 lb

## 2014-10-31 DIAGNOSIS — F411 Generalized anxiety disorder: Secondary | ICD-10-CM

## 2014-10-31 DIAGNOSIS — F1010 Alcohol abuse, uncomplicated: Secondary | ICD-10-CM

## 2014-10-31 DIAGNOSIS — F329 Major depressive disorder, single episode, unspecified: Secondary | ICD-10-CM

## 2014-10-31 MED ORDER — FLUOXETINE HCL 20 MG PO CAPS: 20.0000 mg | ORAL_CAPSULE | Freq: Every day | ORAL | Status: DC

## 2014-10-31 MED ORDER — ALPRAZOLAM 1 MG PO TABS: 1.0000 mg | ORAL_TABLET | Freq: Two times a day (BID) | ORAL | Status: DC
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
## 2014-11-29 ENCOUNTER — Ambulatory Visit (HOSPITAL_COMMUNITY): Payer: Self-pay | Admitting: Psychiatry

## 2014-12-02 ENCOUNTER — Ambulatory Visit (HOSPITAL_COMMUNITY): Payer: Self-pay | Admitting: Psychiatry

## 2014-12-04 ENCOUNTER — Telehealth (HOSPITAL_COMMUNITY): Payer: Self-pay | Admitting: *Deleted

## 2014-12-04 NOTE — Telephone Encounter (Signed)
lmtcb due to needing to resch appt from 12-02-14 due to network being down.

## 2014-12-27 ENCOUNTER — Encounter (HOSPITAL_COMMUNITY): Payer: Self-pay | Admitting: Psychiatry

## 2014-12-27 ENCOUNTER — Ambulatory Visit (INDEPENDENT_AMBULATORY_CARE_PROVIDER_SITE_OTHER): Payer: 59 | Admitting: Psychiatry

## 2014-12-27 VITALS — BP 139/74 | HR 67 | Ht 66.0 in | Wt 135.4 lb

## 2014-12-27 DIAGNOSIS — F332 Major depressive disorder, recurrent severe without psychotic features: Secondary | ICD-10-CM | POA: Diagnosis not present

## 2014-12-27 DIAGNOSIS — F1010 Alcohol abuse, uncomplicated: Secondary | ICD-10-CM

## 2014-12-27 DIAGNOSIS — F411 Generalized anxiety disorder: Secondary | ICD-10-CM | POA: Diagnosis not present

## 2014-12-27 MED ORDER — ALPRAZOLAM 1 MG PO TABS: 1.0000 mg | ORAL_TABLET | Freq: Two times a day (BID) | ORAL | Status: DC

## 2014-12-27 MED ORDER — FLUOXETINE HCL 20 MG PO CAPS: 20.0000 mg | ORAL_CAPSULE | Freq: Every day | ORAL | Status: DC
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
## 2015-02-26 ENCOUNTER — Ambulatory Visit (INDEPENDENT_AMBULATORY_CARE_PROVIDER_SITE_OTHER): Payer: 59 | Admitting: Psychiatry

## 2015-02-26 ENCOUNTER — Encounter (HOSPITAL_COMMUNITY): Payer: Self-pay | Admitting: Psychiatry

## 2015-02-26 VITALS — BP 152/80 | HR 61 | Ht 66.0 in | Wt 135.2 lb

## 2015-02-26 DIAGNOSIS — F332 Major depressive disorder, recurrent severe without psychotic features: Secondary | ICD-10-CM

## 2015-02-26 DIAGNOSIS — F1010 Alcohol abuse, uncomplicated: Secondary | ICD-10-CM

## 2015-02-26 DIAGNOSIS — F411 Generalized anxiety disorder: Secondary | ICD-10-CM

## 2015-02-26 MED ORDER — FLUOXETINE HCL 20 MG PO CAPS: 20.0000 mg | ORAL_CAPSULE | Freq: Every day | ORAL | Status: DC

## 2015-02-26 MED ORDER — ALPRAZOLAM 1 MG PO TABS: 1.0000 mg | ORAL_TABLET | Freq: Two times a day (BID) | ORAL | Status: DC
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
## 2015-05-29 ENCOUNTER — Ambulatory Visit (INDEPENDENT_AMBULATORY_CARE_PROVIDER_SITE_OTHER): Payer: 59 | Admitting: Psychiatry

## 2015-05-29 ENCOUNTER — Encounter (HOSPITAL_COMMUNITY): Payer: Self-pay | Admitting: Psychiatry

## 2015-05-29 VITALS — BP 154/80 | HR 57 | Ht 66.0 in | Wt 133.0 lb

## 2015-05-29 DIAGNOSIS — F411 Generalized anxiety disorder: Secondary | ICD-10-CM | POA: Diagnosis not present

## 2015-05-29 MED ORDER — ALPRAZOLAM 1 MG PO TABS: 1.0000 mg | ORAL_TABLET | Freq: Two times a day (BID) | ORAL | Status: DC

## 2015-05-29 MED ORDER — FLUOXETINE HCL 20 MG PO CAPS: 20.0000 mg | ORAL_CAPSULE | Freq: Every day | ORAL | Status: DC
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
## 2015-08-26 ENCOUNTER — Other Ambulatory Visit (HOSPITAL_COMMUNITY): Payer: Self-pay | Admitting: Psychiatry

## 2015-09-03 ENCOUNTER — Ambulatory Visit (INDEPENDENT_AMBULATORY_CARE_PROVIDER_SITE_OTHER): Payer: BLUE CROSS/BLUE SHIELD | Admitting: Psychiatry

## 2015-09-03 ENCOUNTER — Encounter (HOSPITAL_COMMUNITY): Payer: Self-pay | Admitting: Psychiatry

## 2015-09-03 VITALS — BP 150/84 | HR 60 | Ht 66.0 in | Wt 135.4 lb

## 2015-09-03 DIAGNOSIS — F329 Major depressive disorder, single episode, unspecified: Secondary | ICD-10-CM | POA: Diagnosis not present

## 2015-09-03 DIAGNOSIS — F411 Generalized anxiety disorder: Secondary | ICD-10-CM

## 2015-09-03 DIAGNOSIS — F1010 Alcohol abuse, uncomplicated: Secondary | ICD-10-CM | POA: Diagnosis not present

## 2015-09-03 MED ORDER — ALPRAZOLAM 1 MG PO TABS: 1.0000 mg | ORAL_TABLET | Freq: Two times a day (BID) | ORAL | Status: DC

## 2015-09-03 MED ORDER — FLUOXETINE HCL 20 MG PO CAPS: 20.0000 mg | ORAL_CAPSULE | Freq: Every day | ORAL | Status: DC
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
## 2015-11-11 ENCOUNTER — Other Ambulatory Visit (HOSPITAL_COMMUNITY): Payer: Self-pay | Admitting: Psychiatry

## 2015-12-03 ENCOUNTER — Encounter (HOSPITAL_COMMUNITY): Payer: Self-pay | Admitting: Psychiatry

## 2015-12-03 ENCOUNTER — Ambulatory Visit (INDEPENDENT_AMBULATORY_CARE_PROVIDER_SITE_OTHER): Payer: BLUE CROSS/BLUE SHIELD | Admitting: Psychiatry

## 2015-12-03 VITALS — BP 140/86 | HR 62 | Ht 66.0 in | Wt 138.2 lb

## 2015-12-03 DIAGNOSIS — F332 Major depressive disorder, recurrent severe without psychotic features: Secondary | ICD-10-CM | POA: Diagnosis not present

## 2015-12-03 DIAGNOSIS — F411 Generalized anxiety disorder: Secondary | ICD-10-CM

## 2015-12-03 DIAGNOSIS — F1010 Alcohol abuse, uncomplicated: Secondary | ICD-10-CM

## 2015-12-03 MED ORDER — FLUOXETINE HCL 20 MG PO CAPS: 20.0000 mg | ORAL_CAPSULE | Freq: Every day | ORAL | Status: DC

## 2015-12-03 MED ORDER — ALPRAZOLAM 1 MG PO TABS: 1.0000 mg | ORAL_TABLET | Freq: Two times a day (BID) | ORAL | Status: DC
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
## 2015-12-13 ENCOUNTER — Other Ambulatory Visit (HOSPITAL_COMMUNITY): Payer: Self-pay | Admitting: Psychiatry

## 2016-02-10 ENCOUNTER — Other Ambulatory Visit (HOSPITAL_COMMUNITY): Payer: Self-pay | Admitting: Internal Medicine

## 2016-02-10 DIAGNOSIS — Z1231 Encounter for screening mammogram for malignant neoplasm of breast: Secondary | ICD-10-CM

## 2016-02-10 DIAGNOSIS — Z780 Asymptomatic menopausal state: Secondary | ICD-10-CM

## 2016-02-13 ENCOUNTER — Other Ambulatory Visit (HOSPITAL_COMMUNITY): Payer: Self-pay

## 2016-02-13 ENCOUNTER — Ambulatory Visit (HOSPITAL_COMMUNITY): Payer: Self-pay

## 2016-02-13 ENCOUNTER — Other Ambulatory Visit (HOSPITAL_COMMUNITY): Payer: Self-pay | Admitting: Internal Medicine

## 2016-02-13 DIAGNOSIS — R945 Abnormal results of liver function studies: Secondary | ICD-10-CM

## 2016-02-18 ENCOUNTER — Ambulatory Visit (HOSPITAL_COMMUNITY)
Admission: RE | Admit: 2016-02-18 | Discharge: 2016-02-18 | Disposition: A | Discharge status: Home / Self Care | Payer: BLUE CROSS/BLUE SHIELD | Source: Ambulatory Visit | Attending: Internal Medicine | Admitting: Internal Medicine

## 2016-02-18 DIAGNOSIS — R945 Abnormal results of liver function studies: Secondary | ICD-10-CM | POA: Insufficient documentation

## 2016-03-15 ENCOUNTER — Encounter: Payer: Self-pay | Admitting: Gastroenterology

## 2016-03-17 ENCOUNTER — Ambulatory Visit (HOSPITAL_COMMUNITY): Payer: Self-pay

## 2016-03-18 ENCOUNTER — Ambulatory Visit (HOSPITAL_COMMUNITY)
Admission: RE | Admit: 2016-03-18 | Discharge: 2016-03-18 | Disposition: A | Discharge status: Home / Self Care | Payer: BLUE CROSS/BLUE SHIELD | Source: Ambulatory Visit | Attending: Internal Medicine | Admitting: Internal Medicine

## 2016-03-18 DIAGNOSIS — Z1231 Encounter for screening mammogram for malignant neoplasm of breast: Secondary | ICD-10-CM

## 2016-03-18 DIAGNOSIS — Z780 Asymptomatic menopausal state: Secondary | ICD-10-CM | POA: Insufficient documentation

## 2016-04-02 ENCOUNTER — Other Ambulatory Visit (HOSPITAL_COMMUNITY): Payer: Self-pay | Admitting: Psychiatry

## 2016-04-07 ENCOUNTER — Ambulatory Visit (INDEPENDENT_AMBULATORY_CARE_PROVIDER_SITE_OTHER): Payer: BLUE CROSS/BLUE SHIELD | Admitting: Psychiatry

## 2016-04-07 ENCOUNTER — Encounter (HOSPITAL_COMMUNITY): Payer: Self-pay | Admitting: Psychiatry

## 2016-04-07 VITALS — BP 166/94 | HR 74 | Ht 66.0 in | Wt 140.2 lb

## 2016-04-07 DIAGNOSIS — F411 Generalized anxiety disorder: Secondary | ICD-10-CM | POA: Diagnosis not present

## 2016-04-07 MED ORDER — ALPRAZOLAM 1 MG PO TABS: 1.0000 mg | ORAL_TABLET | Freq: Two times a day (BID) | ORAL | 3 refills | Status: DC

## 2016-04-07 MED ORDER — FLUOXETINE HCL 20 MG PO CAPS: 20.0000 mg | ORAL_CAPSULE | Freq: Every day | ORAL | 3 refills | Status: DC
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
## 2016-04-15 ENCOUNTER — Encounter: Payer: Self-pay | Admitting: Gastroenterology

## 2016-04-15 ENCOUNTER — Ambulatory Visit (INDEPENDENT_AMBULATORY_CARE_PROVIDER_SITE_OTHER): Payer: BLUE CROSS/BLUE SHIELD | Admitting: Gastroenterology

## 2016-04-15 VITALS — BP 165/93 | HR 74 | Temp 96.8°F | Ht 66.0 in | Wt 138.6 lb

## 2016-04-15 DIAGNOSIS — R894 Abnormal immunological findings in specimens from other organs, systems and tissues: Secondary | ICD-10-CM | POA: Diagnosis not present

## 2016-04-15 DIAGNOSIS — R768 Other specified abnormal immunological findings in serum: Secondary | ICD-10-CM | POA: Insufficient documentation

## 2016-04-15 DIAGNOSIS — R7989 Other specified abnormal findings of blood chemistry: Secondary | ICD-10-CM | POA: Diagnosis not present

## 2016-04-15 DIAGNOSIS — R945 Abnormal results of liver function studies: Secondary | ICD-10-CM

## 2016-04-15 NOTE — Patient Instructions (Signed)
1. Please have your labs done. 2. Collect stool specimen and return to our office. 3. We will call with further instructions once your labs are available.

## 2016-04-15 NOTE — Progress Notes (Signed)
Primary Care Physician:  Pearson GrippeJames Kim, MD  Primary Gastroenterologist:  Jonette EvaSandi Fields, MD   Chief Complaint  Patient presents with  . Hepatitis C    referred from PCP    HPI:  Maria Petty is a 65 y.o. female here At the request of Dr. Pearson GrippeJames Kim for further evaluation of chronic hepatitis C. Patient reports not seen a doctor in over 25 years until recently establishing care with Judee ClaraJulie Johnson, nurse practitioner with Dr. Pearson GrippeJames Kim.  In May she woke up with a headache, anorexia. Felt like her sinuses were full. She was also having some epigastric pain. Overall just not feeling right. Decided to establish care with a PCP. Labs obtained. She was noted to have a positive hepatitis C antibody, AST 212, ALT 218, alkaline phosphatase 66, total bilirubin 0.3, albumin 4.3, hemoglobin 13.6. Other lab parameters as outlined below.  Patient states that she started researching hepatitis C. Because of persisting headache and brain fog she really thought she may have hepatic encephalopathy so she decided to take Keflex that was left over from a prescription for her dog. Denies any confusion, tremors. Denies any weight loss. Weight is been creeping up. She's been a vegetarian for 13 years and has a great diet per her report. As a 30+ year history of alcohol abuse but none in the past 18 months. History of generalized anxiety disorder. She's been having some constipation, may have a bowel movement 3 times daily or skip 2 days. Gained 5 pounds utilizing prunes for bowel function. Denies melena, rectal bleeding.No abdominal pain, no heartburn.  Patient very secretive about her past. She did not admit to history of illicit drug use. However per PCP notes she used IV drugs back in her 7120s and had multiple sexual partners. She reported to demonstrate been clean for 20 years. 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 She has multiple times how  much of her history had included documentation. She's concerned about her insurance issues.  Patient has never had a colonoscopy. Her mother succumbed of colon cancer in her 2380s. Always has been a transportation issue and concerns about the bowel prep.  PATIENT TELLS ME THAT SHE KNOWS MY AFFILIATION IE DEMOCRAT VS REPUBLIC AND THAT'S WHY SHE CAME TO SEE ME.   Patient admitted in 2013 with alcoholic pancreatitis.  Current Outpatient Prescriptions  Medication Sig Dispense Refill  . ALPRAZolam (XANAX) 1 MG tablet Take 1 tablet (1 mg total) by mouth 2 (two) times daily. 60 tablet 3  . FLUoxetine (PROZAC) 20 MG capsule Take 1 capsule (20 mg total) by mouth daily. 30 capsule 3  . levETIRAcetam (KEPPRA) 500 MG tablet Take 500 mg by mouth 2 (two) times daily.    . Multiple Vitamin (MULTIVITAMIN WITH MINERALS) TABS Take 1 tablet by mouth daily.     No current facility-administered medications for this visit.     Allergies as of 04/15/2016  . (No Known Allergies)    Past Medical History:  Diagnosis Date  . Acute alcoholic pancreatitis 05/13/2012  . Alcohol abuse 05/13/2012  . Depression   . History of seizures 05/13/2012  . Hypoglycemia   . Hyponatremia 05/13/2012  . Macrocytic Anemia 05/13/2012  . Raynaud's syndrome   . Seizures (HCC)     Past Surgical History:  Procedure Laterality Date  . RHINOPLASTY    . TUBAL LIGATION      Family History  Problem Relation Age of Onset  . Colon cancer Mother     [id number]  . Depression Father   . Cervical cancer Maternal Aunt     Social History   Social History  . Marital status: Divorced    Spouse name: N/A  . Number of children: N/A  . Years of education: N/A   Occupational History  . Not on file.   Social History Main Topics  . Smoking status: Never Smoker  . Smokeless tobacco: Not on file  . Alcohol use No     Comment: history of heavy etoh abuse for 30+ years, but quit in 2015 per patient  . Drug use: No  . Sexual activity: Yes     Birth control/ protection: Surgical   Other Topics Concern  . Not on file   Social History Narrative  . No narrative on file      ROS:  General: Negative for anorexia, weight loss, fever, chills, fatigue, weakness. Eyes: Negative for vision changes.  ENT: Negative for hoarseness, difficulty swallowing , nasal congestion. CV: Negative for chest pain, angina, palpitations, dyspnea on exertion, peripheral edema.  Respiratory: Negative for dyspnea at rest, dyspnea on exertion, cough, sputum, wheezing.  GI: See history of present illness. GU:  Negative for dysuria, hematuria, urinary incontinence, urinary frequency, nocturnal urination.  MS: Negative for joint pain, low back pain.  Derm: Negative for rash or itching.  Neuro: Negative for weakness, abnormal sensation, seizure, frequent headaches, memory loss, confusion.  Psych: Negative for anxiety, depression, suicidal ideation, hallucinations.  Endo: Negative for unusual weight change.  Heme: Negative for bruising or bleeding. Allergy: Negative for rash or hives.    Physical Examination:  BP (!) 165/93   Pulse 74   Temp (!) 96.8 F (36 C) (Oral)   Ht 5\' 6"  (1.676 m)   Wt 138 lb 9.6 oz (62.9 kg)   BMI 22.37 kg/m    General: Well-nourished, well-developed in no acute distress.  Head: Normocephalic, atraumatic.   Eyes: Conjunctiva pink, no icterus. Mouth: Oropharyngeal mucosa moist and pink , no lesions erythema or exudate. Neck: Supple without thyromegaly, masses, or lymphadenopathy.  Lungs: Clear to auscultation bilaterally.  Heart: Regular rate and rhythm, no murmurs rubs or gallops.  Abdomen: Bowel sounds are normal, nontender, nondistended, no hepatosplenomegaly or masses, no abdominal bruits or    hernia , no rebound or guarding.   Rectal: not performed Extremities: No lower extremity edema. No clubbing or deformities.  Neuro: Alert and oriented x 4 , grossly normal neurologically.  Skin: Warm and dry, no rash or  jaundice.   Psych: Alert and cooperative, normal mood and affect.  Labs: July 2017 Glucose 91, BUN 8, creatinine 0.047, total bilirubin 0.3, alkaline phosphatase 66, AST 212, ALT 218, hepatitis B surface antibody nonreactive, hepatitis C antibody greater than 11, hepatitis B surface antigen negative, hepatitis A IgM negative, hep b surface antibody NR, hemoglobin A1c 5, TSH 1.830, white blood cell count 4300, hemoglobin 13.6, hematocrit 40.5, MCV 97, platelets 221,000  Imaging Studies:  Right upper quadrant Abdominal ultrasound dated 02/18/2016 Liver appear normal. Gallbladder unremarkable.

## 2016-04-16 ENCOUNTER — Telehealth: Payer: Self-pay | Admitting: Gastroenterology

## 2016-04-16 ENCOUNTER — Encounter: Payer: Self-pay | Admitting: Gastroenterology

## 2016-04-16 NOTE — Telephone Encounter (Signed)
Pt was seen yesterday and called this morning to ask if we would send the lab order to [facility] instead of [facility].  She is going to try to bring her stool sample by today before noon or either Monday.

## 2016-04-16 NOTE — Telephone Encounter (Signed)
Pt called back with several questions about her lab orders. Call was dropped. Please call patient back at [phone]

## 2016-04-16 NOTE — Telephone Encounter (Signed)
LMOM for a return call.  

## 2016-04-19 ENCOUNTER — Ambulatory Visit (INDEPENDENT_AMBULATORY_CARE_PROVIDER_SITE_OTHER): Payer: BLUE CROSS/BLUE SHIELD

## 2016-04-19 DIAGNOSIS — Z1211 Encounter for screening for malignant neoplasm of colon: Secondary | ICD-10-CM | POA: Diagnosis not present

## 2016-04-19 LAB — IFOBT (OCCULT BLOOD): IMMUNOLOGICAL FECAL OCCULT BLOOD TEST: NEGATIVE

## 2016-04-19 NOTE — Progress Notes (Signed)
Pt returned one iFOBT and it was negative.  

## 2016-04-20 ENCOUNTER — Encounter: Payer: Self-pay | Admitting: Gastroenterology

## 2016-04-20 NOTE — Assessment & Plan Note (Addendum)
65 year old female who presents for further evaluation of positive hepatitis C antibody testing, abnormal LFTs. Need to confirm chronic HCV. Degree of elevation of transaminases somewhat more than typically seen with chronic HCV. Recent ultrasound with unremarkable liver which is reassuring especially in light of chronic alcohol abuse.  Clinically patient overall doing well. Reassured her that she has no reason to believe that she recently had hepatic encephalopathy. We will pursue further labs regarding abnormal LFTs and to confirm chronic HCV. We did discuss potential use of harvoni for chronic hepatitis C if she does indeed have active virus.  In addition, patient has never had a colonoscopy. Mother had colon cancer but this was at an advanced age. She is somewhat reluctant in pursuing because of lack of transportation and bowel prep. We can discuss again at a later date when she is ready. She is interested and I FOBT testing. We briefly discussed Cologuard tesing for colon cancer if she decides against colonoscopy. She is aware that Cologuard is used to detect colon cancer NOT prevent colon cancer.

## 2016-04-21 NOTE — Progress Notes (Signed)
CC'ED TO PCP 

## 2016-04-22 LAB — HCV RNA QUANT RFLX ULTRA OR GENOTYP
HCV Quant Baseline: 5250000 IU/mL
HCV log10: 6.72 log10 IU/mL

## 2016-04-22 LAB — HIV ANTIBODY (ROUTINE TESTING W REFLEX): HIV Screen 4th Generation wRfx: NONREACTIVE

## 2016-04-22 LAB — TISSUE TRANSGLUTAMINASE, IGA: Transglutaminase IgA: <2 U/mL (ref 0–3)

## 2016-04-22 LAB — PROTIME-INR
INR: 1.1 (ref 0.8–1.2)
Prothrombin Time: 11.5 s (ref 9.1–12.0)

## 2016-04-22 LAB — HEPATIC FUNCTION PANEL
ALBUMIN: 4.4 g/dL (ref 3.6–4.8)
ALT: 237 IU/L — ABNORMAL HIGH (ref 0–32)
AST: 213 IU/L — ABNORMAL HIGH (ref 0–40)
Alkaline Phosphatase: 78 IU/L (ref 39–117)
Bilirubin Total: 0.4 mg/dL (ref 0.0–1.2)
Bilirubin, Direct: 0.14 mg/dL (ref 0.00–0.40)
TOTAL PROTEIN: 8.1 g/dL (ref 6.0–8.5)

## 2016-04-22 LAB — IRON AND TIBC
IRON SATURATION: 23 % (ref 15–55)
Iron: 97 ug/dL (ref 27–139)
TIBC: 424 ug/dL (ref 250–450)
UIBC: 327 ug/dL (ref 118–369)

## 2016-04-22 LAB — HEPATITIS C GENOTYPE

## 2016-04-22 LAB — ANA: Anti Nuclear Antibody(ANA): NEGATIVE

## 2016-04-22 LAB — MITOCHONDRIAL/SMOOTH MUSCLE AB PNL
Mitochondrial Ab: 29 Units — ABNORMAL HIGH (ref 0.0–20.0)
SMOOTH MUSCLE AB: 14 U (ref 0–19)

## 2016-04-22 LAB — CERULOPLASMIN: Ceruloplasmin: 32.4 mg/dL (ref 19.0–39.0)

## 2016-04-22 LAB — HEPATITIS A ANTIBODY, TOTAL: HEP A TOTAL AB: NEGATIVE

## 2016-04-22 LAB — FERRITIN: Ferritin: 174 ng/mL — ABNORMAL HIGH (ref 15–150)

## 2016-04-23 NOTE — Progress Notes (Signed)
[facility] Fibrospect, 03/2016: F3-F4, index score 43 which tips into the F3-4 range.

## 2016-04-30 NOTE — Progress Notes (Signed)
She has chronic HCV, genotype 1a. AMA positive (cannot exclude underlying autoimmune process or PBC but alkphos normal which goes against).  Celiac screen negative. HIV negative.   -->She needs vaccinations for Hep A/B. Please arrange.  -->Start Harvoni approval process.  -->We will monitor LFTs, AMA for now, see if improves with HCV treatment.

## 2016-04-30 NOTE — Progress Notes (Signed)
Please let patient know that ifobt was negative.

## 2016-05-04 NOTE — Progress Notes (Signed)
Pt is aware and I am leaving the Rx for the Hep A and B vaccines at front desk for her to pick up.

## 2016-05-04 NOTE — Progress Notes (Signed)
Pt is aware.  

## 2016-05-20 ENCOUNTER — Telehealth: Payer: Self-pay

## 2016-05-20 NOTE — Telephone Encounter (Signed)
Pt came by to see what plan of care is now. Said she feels really bad some days and she was hoping she could start on the Harvoni soon.  Please advise!

## 2016-05-21 NOTE — Telephone Encounter (Signed)
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 I am waiting for recommendations from Hepatitis C/hepatology specialist's opinion [place] in [place].   Hopefully we will know something first of the week.

## 2016-05-21 NOTE — Telephone Encounter (Signed)
Pt is aware.  

## 2016-05-24 NOTE — Telephone Encounter (Signed)
Please let patient know that I discussed her case with Jerrilyn Cairoawn Drazyk, NP at [facility].  We will proceed with Harvoni approval process for HCV treatment. We will monitor LFTs during treatment. Depending on what her AST/ALT/AMA do after treatment, we may have to obtain liver biopsy later.   I have completed Harvoni paperwork and given to Raynelle FanningJulie to fax.

## 2016-05-24 NOTE — Telephone Encounter (Signed)
Paperwork faxed to [employer]. Routing to [employer].

## 2016-05-26 NOTE — Telephone Encounter (Signed)
Pt is aware.  

## 2016-05-27 NOTE — Telephone Encounter (Signed)
Harvoni has been approved. Her first shipment will arrive on Tuesday. What instructions do you want to give her?

## 2016-05-28 NOTE — Telephone Encounter (Addendum)
Please make sure her medication list has not changed. Please remind her that if she gets new prescriptions or decides she needs over-the-counter medications, she has to approve those with us first as many medications can interfere with her hepatitis C medication.  She can come by and pick up her medication. Please document start date. Needs to take Harvoni once daily, at the same time each day. Do not miss any doses. Do not misplace any medication.  She needs to have the following labs in 4 weeks after starting therapy. Also needs to have a follow-up office visit in 4 weeks after starting therapy. HCV RNA quantitative, CBC, CMET.  PLEASE MAKE SURE SHE HAS RECEIVED HARVONI PATIENT INFORMATION PAMPHLET.

## 2016-06-01 ENCOUNTER — Other Ambulatory Visit: Payer: Self-pay

## 2016-06-01 ENCOUNTER — Other Ambulatory Visit: Payer: Self-pay | Admitting: Gastroenterology

## 2016-06-01 DIAGNOSIS — R768 Other specified abnormal immunological findings in serum: Secondary | ICD-10-CM

## 2016-06-01 NOTE — Telephone Encounter (Signed)
Letter done with instructions. Pt came in and I went over these instructions with her. She is going to start taking it today. Lab orders done and on file. Misty StanleyStacey has made her a follow up appointment. Information pamphlet given to the pt. Pt asked if she could take benadryl prn with harvoni, I asked LSL and she said it was fine. Pt is aware.

## 2016-06-02 NOTE — Telephone Encounter (Signed)
Noted  

## 2016-06-15 ENCOUNTER — Telehealth: Payer: Self-pay | Admitting: Gastroenterology

## 2016-06-15 NOTE — Telephone Encounter (Signed)
Pt was just prescribed Lisinopril 10 mg this Am by her PCP, Dr. Selena BattenKIM. Her BP has been running up and the top was in the 170's this Am. She will not take it until she hears from Tana [place]lie [place], [place].

## 2016-06-15 NOTE — Telephone Encounter (Signed)
Patient called to ask if it's ok for her to take her harvoni and lisinopril. Please advise and call her at [phone]

## 2016-06-16 NOTE — Telephone Encounter (Signed)
PT is aware.

## 2016-06-16 NOTE — Telephone Encounter (Signed)
It's ok to take Lisinopril.

## 2016-06-18 ENCOUNTER — Telehealth: Payer: Self-pay | Admitting: Gastroenterology

## 2016-06-18 NOTE — Telephone Encounter (Signed)
Pt has picked up Harvoni.

## 2016-06-18 NOTE — Telephone Encounter (Signed)
Pt called at 1135 to say that we should have gotten her Harvoni today at 1030 am and [place] [place] and would stop by to pick it up. She is aware that we close at noon.

## 2016-06-29 ENCOUNTER — Telehealth: Payer: Self-pay | Admitting: Gastroenterology

## 2016-06-29 NOTE — Telephone Encounter (Signed)
Pt called at 1210 saying that she received a letter from us to have lab work done and she is [facility] of [facility]. Do we need to forward her lab orders to [facility]?

## 2016-06-29 NOTE — Telephone Encounter (Signed)
LMOM for pt to take the lab orders with her to [facility].

## 2016-07-01 LAB — COMPREHENSIVE METABOLIC PANEL
ALK PHOS: 58 IU/L (ref 39–117)
ALT: 25 IU/L (ref 0–32)
AST: 29 IU/L (ref 0–40)
Albumin/Globulin Ratio: 1.3 (ref 1.2–2.2)
Albumin: 4.3 g/dL (ref 3.6–4.8)
BILIRUBIN TOTAL: 0.4 mg/dL (ref 0.0–1.2)
BUN/Creatinine Ratio: 18 (ref 12–28)
BUN: 10 mg/dL (ref 8–27)
CHLORIDE: 94 mmol/L — AB (ref 96–106)
CO2: 27 mmol/L (ref 18–29)
Calcium: 9.5 mg/dL (ref 8.7–10.3)
Creatinine, Ser: 0.56 mg/dL — ABNORMAL LOW (ref 0.57–1.00)
GFR calc Af Amer: 115 mL/min/{1.73_m2} (ref >59)
GFR calc non Af Amer: 100 mL/min/{1.73_m2} (ref >59)
GLUCOSE: 86 mg/dL (ref 65–99)
Globulin, Total: 3.2 g/dL (ref 1.5–4.5)
Potassium: 4.7 mmol/L (ref 3.5–5.2)
Sodium: 137 mmol/L (ref 134–144)
Total Protein: 7.5 g/dL (ref 6.0–8.5)

## 2016-07-01 LAB — CBC WITH DIFFERENTIAL/PLATELET
BASOS ABS: 0.1 10*3/uL (ref 0.0–0.2)
Basos: 1 %
EOS (ABSOLUTE): 0.5 10*3/uL — AB (ref 0.0–0.4)
Eos: 9 %
Hematocrit: 40.1 % (ref 34.0–46.6)
Hemoglobin: 13.1 g/dL (ref 11.1–15.9)
IMMATURE GRANS (ABS): 0 10*3/uL (ref 0.0–0.1)
Immature Granulocytes: 0 %
LYMPHS ABS: 1.4 10*3/uL (ref 0.7–3.1)
LYMPHS: 27 %
MCH: 31.3 pg (ref 26.6–33.0)
MCHC: 32.7 g/dL (ref 31.5–35.7)
MCV: 96 fL (ref 79–97)
Monocytes Absolute: 0.7 10*3/uL (ref 0.1–0.9)
Monocytes: 12 %
NEUTROS ABS: 2.7 10*3/uL (ref 1.4–7.0)
Neutrophils: 51 %
PLATELETS: 223 10*3/uL (ref 150–379)
RBC: 4.19 x10E6/uL (ref 3.77–5.28)
RDW: 13.6 % (ref 12.3–15.4)
WBC: 5.3 10*3/uL (ref 3.4–10.8)

## 2016-07-01 LAB — HCV RNA QUANT: Hepatitis C Quantitation: NOT DETECTED IU/mL

## 2016-07-05 ENCOUNTER — Encounter: Payer: Self-pay | Admitting: Gastroenterology

## 2016-07-05 ENCOUNTER — Ambulatory Visit (INDEPENDENT_AMBULATORY_CARE_PROVIDER_SITE_OTHER): Payer: BLUE CROSS/BLUE SHIELD | Admitting: Gastroenterology

## 2016-07-05 DIAGNOSIS — B182 Chronic viral hepatitis C: Secondary | ICD-10-CM

## 2016-07-05 NOTE — Progress Notes (Signed)
CC'ED TO PCP 

## 2016-07-05 NOTE — Progress Notes (Signed)
Primary Care Physician: Maria GrippeJames Kim, MD  Primary Gastroenterologist:  Maria EvaSandi Fields, MD   Chief Complaint  Patient presents with  . Abdominal Pain    upper mid abd "little bit"  . Follow-up    f/u Harvoni tx, feels tired, but overall better than before starting tx    HPI: Maria Petty is a 65 y.o. female hereFor one-month follow-up after starting Harvoni. She started on 06/01/2016. Compliant with medication. No side effects. Continues with fatigue although feels better. She's had some vague upper abdominal discomfort. Really cannot associate with food or without. No typical heartburn. She states is very mild and likely related to some she eats. Bowel function is regular. No blood in the stool or melena.  LFTs back in August, AST and ALT in the 200 range. Mitochondrial antibody positive at 29. Alkaline phosphatase was normal. Discussed with Maria Majorawn Drazek at [facility]. Recommended treating hep C and then rechecking AMA afterwards.  Current Outpatient Prescriptions  Medication Sig Dispense Refill  . ALPRAZolam (XANAX) 1 MG tablet Take 1 tablet (1 mg total) by mouth 2 (two) times daily. 60 tablet 3  . FLUoxetine (PROZAC) 20 MG capsule Take 1 capsule (20 mg total) by mouth daily. 30 capsule 3  . HARVONI 90-400 MG TABS Take 1 tablet by mouth daily.  2  . levETIRAcetam (KEPPRA) 500 MG tablet Take 500 mg by mouth 2 (two) times daily.    Marland Kitchen. lisinopril (PRINIVIL,ZESTRIL) 10 MG tablet Take 10 mg by mouth daily.  4  . Multiple Vitamin (MULTIVITAMIN WITH MINERALS) TABS Take 1 tablet by mouth daily.     No current facility-administered medications for this visit.     Allergies as of 07/05/2016  . (No Known Allergies)    ROS:  General: Negative for anorexia, weight loss, fever, chills, weakness.Positive fatigue but improved ENT: Negative for hoarseness, difficulty swallowing , nasal congestion. CV: Negative for chest pain, angina, palpitations, dyspnea on exertion, peripheral edema.    Respiratory: Negative for dyspnea at rest, dyspnea on exertion, cough, sputum, wheezing.  GI: See history of present illness. GU:  Negative for dysuria, hematuria, urinary incontinence, urinary frequency, nocturnal urination.  Endo: Negative for unusual weight change.    Physical Examination:   BP (!) 156/86   Pulse 75   Temp 97.8 F (36.6 C) (Oral)   Ht 5\' 6"  (1.676 m)   Wt 140 lb 9.6 oz (63.8 kg)   BMI 22.69 kg/m   General: Well-nourished, well-developed in no acute distress.  Eyes: No icterus. Mouth: Oropharyngeal mucosa moist and pink , no lesions erythema or exudate. Lungs: Clear to auscultation bilaterally.  Heart: Regular rate and rhythm, no murmurs rubs or gallops.  Abdomen: Bowel sounds are normal, nontender, nondistended, no hepatosplenomegaly or masses, no abdominal bruits or hernia , no rebound or guarding.   Extremities: No lower extremity edema. No clubbing or deformities. Neuro: Alert and oriented x 4   Skin: Warm and dry, no jaundice.   Psych: Alert and cooperative, normal mood and affect.  Labs:  Lab Results  Component Value Date   ALT 25 06/29/2016   AST 29 06/29/2016   ALKPHOS 58 06/29/2016   BILITOT 0.4 06/29/2016   Lab Results  Component Value Date   CREATININE 0.56 (L) 06/29/2016   BUN 10 06/29/2016   NA 137 06/29/2016   K 4.7 06/29/2016   CL 94 (L) 06/29/2016   CO2 27 06/29/2016   Lab Results  Component Value Date  WBC 5.3 06/29/2016   HGB 10.9 (L) 05/14/2012   HCT 40.1 06/29/2016   MCV 96 06/29/2016   PLT 223 06/29/2016   HCV RNA Quant from 06/29/2016, HCV not detected  Imaging Studies: No results found.

## 2016-07-05 NOTE — Patient Instructions (Signed)
1. You will need to complete 12 weeks of Harvoni. Only 8 more to go!  2. We will recheck your labs in 8 weeks and plan for follow-up office visit at that time. 3. If you have ongoing issues with stomach pain, please let me know.

## 2016-07-05 NOTE — Assessment & Plan Note (Addendum)
65 year old female with chronic hepatitis C. Has completed 4 weeks of Harvoni. Viral count now negative. AST/ALT have come down from 200 range to normal. She will complete total of 12 weeks of therapy given F3/F4 score. She has a history of positive AMA as outlined before. We will recheck this at the completion of therapy. She'll come back in 8 weeks with labs and office visit.   Will address colorectal cancer screening later date. Patient was not interested previously, wants to complete hepatitis C treatment first. She has never had a colonoscopy. Her mother had colon cancer in her 6480s but no other family history of colon cancer.

## 2016-07-05 NOTE — Progress Notes (Signed)
Discussed at OV

## 2016-07-13 ENCOUNTER — Telehealth: Payer: Self-pay | Admitting: Gastroenterology

## 2016-07-13 NOTE — Telephone Encounter (Signed)
[employer] called and said that patient's Maria Petty delivery would be on 11/16th before 5pm via [employer].

## 2016-07-14 NOTE — Telephone Encounter (Signed)
noted 

## 2016-07-19 ENCOUNTER — Other Ambulatory Visit: Payer: Self-pay | Admitting: Gastroenterology

## 2016-07-19 DIAGNOSIS — B182 Chronic viral hepatitis C: Secondary | ICD-10-CM

## 2016-07-20 ENCOUNTER — Encounter (HOSPITAL_COMMUNITY): Payer: Self-pay | Admitting: Psychiatry

## 2016-07-20 ENCOUNTER — Ambulatory Visit (INDEPENDENT_AMBULATORY_CARE_PROVIDER_SITE_OTHER): Payer: BLUE CROSS/BLUE SHIELD | Admitting: Psychiatry

## 2016-07-20 VITALS — BP 153/86 | HR 75 | Resp 16 | Wt 139.6 lb

## 2016-07-20 DIAGNOSIS — Z818 Family history of other mental and behavioral disorders: Secondary | ICD-10-CM

## 2016-07-20 DIAGNOSIS — F411 Generalized anxiety disorder: Secondary | ICD-10-CM

## 2016-07-20 DIAGNOSIS — Z800 Family history of malignant neoplasm of digestive organs: Secondary | ICD-10-CM

## 2016-07-20 DIAGNOSIS — F332 Major depressive disorder, recurrent severe without psychotic features: Secondary | ICD-10-CM | POA: Diagnosis not present

## 2016-07-20 DIAGNOSIS — F1010 Alcohol abuse, uncomplicated: Secondary | ICD-10-CM | POA: Diagnosis not present

## 2016-07-20 MED ORDER — ALPRAZOLAM 1 MG PO TABS: 1.0000 mg | ORAL_TABLET | Freq: Two times a day (BID) | ORAL | 3 refills | Status: DC

## 2016-07-20 MED ORDER — FLUOXETINE HCL 20 MG PO CAPS: 20.0000 mg | ORAL_CAPSULE | Freq: Every day | ORAL | 3 refills | Status: DC
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
## 2016-07-27 ENCOUNTER — Ambulatory Visit (HOSPITAL_COMMUNITY): Payer: Self-pay | Admitting: Psychiatry

## 2016-08-05 ENCOUNTER — Ambulatory Visit (HOSPITAL_COMMUNITY): Payer: Self-pay | Admitting: Psychiatry

## 2016-08-06 ENCOUNTER — Other Ambulatory Visit: Payer: Self-pay

## 2016-08-06 DIAGNOSIS — B182 Chronic viral hepatitis C: Secondary | ICD-10-CM

## 2016-08-31 ENCOUNTER — Other Ambulatory Visit: Payer: Self-pay | Admitting: Gastroenterology

## 2016-09-01 LAB — HEPATIC FUNCTION PANEL
ALBUMIN: 4.6 g/dL (ref 3.6–4.8)
ALT: 18 IU/L (ref 0–32)
AST: 27 IU/L (ref 0–40)
Alkaline Phosphatase: 51 IU/L (ref 39–117)
BILIRUBIN, DIRECT: 0.1 mg/dL (ref 0.00–0.40)
Bilirubin Total: 0.3 mg/dL (ref 0.0–1.2)
TOTAL PROTEIN: 7.7 g/dL (ref 6.0–8.5)

## 2016-09-01 LAB — HCV RNA QUANT: Hepatitis C Quantitation: NOT DETECTED IU/mL

## 2016-09-01 LAB — MITOCHONDRIAL ANTIBODIES: MITOCHONDRIAL AB: 14.6 U (ref 0.0–20.0)

## 2016-09-03 ENCOUNTER — Other Ambulatory Visit: Payer: Self-pay

## 2016-09-03 ENCOUNTER — Ambulatory Visit: Payer: BLUE CROSS/BLUE SHIELD | Admitting: Gastroenterology

## 2016-09-03 DIAGNOSIS — B182 Chronic viral hepatitis C: Secondary | ICD-10-CM

## 2016-09-03 DIAGNOSIS — R945 Abnormal results of liver function studies: Principal | ICD-10-CM

## 2016-09-03 DIAGNOSIS — R7989 Other specified abnormal findings of blood chemistry: Secondary | ICD-10-CM

## 2016-09-03 NOTE — Progress Notes (Signed)
Please let patient know her HCV RNA is NEGATIVE. Her LFTs are normal. Her AMA which was elevated before is now normal as well!  She had appt today but cancelled. She needs to reschedule OV, nonurgent. Need to discuss ongoing liver care for F3/F4 score. Needs u/s every 6 months. And consider colonoscopy.   We will recheck LFTs, HCVRNA quantitative in 3 months.
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
## 2016-09-24 ENCOUNTER — Encounter: Payer: Self-pay | Admitting: Gastroenterology

## 2016-09-24 ENCOUNTER — Ambulatory Visit: Payer: BLUE CROSS/BLUE SHIELD | Admitting: Gastroenterology

## 2016-09-24 ENCOUNTER — Ambulatory Visit (INDEPENDENT_AMBULATORY_CARE_PROVIDER_SITE_OTHER): Payer: BLUE CROSS/BLUE SHIELD | Admitting: Gastroenterology

## 2016-09-24 DIAGNOSIS — B182 Chronic viral hepatitis C: Secondary | ICD-10-CM | POA: Diagnosis not present

## 2016-09-24 NOTE — Progress Notes (Signed)
Primary Care Physician: Pearson GrippeJames Kim, MD  Primary Gastroenterologist:  Jonette EvaSandi Fields, MD]  Chief Complaint  Patient presents with  . Hepatitis    HPI: Maria Petty is a 66 y.o. female here for follow up. Last seen in 06/2016. Has completed 12 weeks of Harvoni for genotype 1a in setting of F3/F4 scores, index score 43 on Prometheus Fibrospect 03/2016. She has negative HCV RNA at completion of treatment. She has h/o positive AMA but this normalized with treatment of her HCV. She declines on colonoscopy. Previous ifobt negative and currently not interested in any options such as Cologuard or yearly ifobt. She has requested to table the discussion for colon cancer screening.   Clinically feels well. No GI complaints. SHE WANTS ALL LABS WITH [facility].  Current Outpatient Prescriptions  Medication Sig Dispense Refill  . ALPRAZolam (XANAX) 1 MG tablet Take 1 tablet (1 mg total) by mouth 2 (two) times daily. 60 tablet 3  . FLUoxetine (PROZAC) 20 MG capsule Take 1 capsule (20 mg total) by mouth daily. 30 capsule 3  . levETIRAcetam (KEPPRA) 500 MG tablet Take 500 mg by mouth 2 (two) times daily.    Marland Kitchen. lisinopril (PRINIVIL,ZESTRIL) 10 MG tablet Take 10 mg by mouth daily.  4  . Multiple Vitamin (MULTIVITAMIN WITH MINERALS) TABS Take 1 tablet by mouth daily.     No current facility-administered medications for this visit.     Allergies as of 09/24/2016  . (No Known Allergies)   Past Medical History:  Diagnosis Date  . Acute alcoholic pancreatitis 05/13/2012  . Alcohol abuse 05/13/2012  . Chronic hepatitis C (HCC)    eradicated with Harvoni 2017/2018  . Depression   . History of seizures 05/13/2012  . Hypoglycemia   . Hyponatremia 05/13/2012  . Macrocytic Anemia 05/13/2012  . Raynaud's syndrome   . Seizures (HCC)    Past Surgical History:  Procedure Laterality Date  . RHINOPLASTY    . TUBAL LIGATION     Family History  Problem Relation Age of Onset  . Colon cancer Mother     [id number]  .  Depression Father   . Cervical cancer Maternal Aunt    Social History   Social History  . Marital status: Divorced    Spouse name: N/A  . Number of children: N/A  . Years of education: N/A   Social History Main Topics  . Smoking status: Never Smoker  . Smokeless tobacco: Never Used  . Alcohol use No     Comment: history of heavy etoh abuse for 30+ years, but quit in 2015 per patient  . Drug use: No  . Sexual activity: Yes    Birth control/ protection: Surgical   Other Topics Concern  . None   Social History Narrative  . None    ROS:  General: Negative for anorexia, weight loss, fever, chills, fatigue, weakness. ENT: Negative for hoarseness, difficulty swallowing , nasal congestion. CV: Negative for chest pain, angina, palpitations, dyspnea on exertion, peripheral edema.  Respiratory: Negative for dyspnea at rest, dyspnea on exertion, cough, sputum, wheezing.  GI: See history of present illness. GU:  Negative for dysuria, hematuria, urinary incontinence, urinary frequency, nocturnal urination.  Endo: Negative for unusual weight change.    Physical Examination:   BP (!) 148/82   Pulse 65   Temp 98 F (36.7 C) (Oral)   Ht 5\' 6"  (1.676 m)   Wt 136 lb 3.2 oz (61.8 kg)   BMI 21.98 kg/m  General: Well-nourished, well-developed in no acute distress.  Eyes: No icterus. Mouth: Oropharyngeal mucosa moist and pink , no lesions erythema or exudate. Lungs: Clear to auscultation bilaterally.  Heart: Regular rate and rhythm, no murmurs rubs or gallops.  Abdomen: Bowel sounds are normal, nontender, nondistended, no hepatosplenomegaly or masses, no abdominal bruits or hernia , no rebound or guarding.   Extremities: No lower extremity edema. No clubbing or deformities. Neuro: Alert and oriented x 4   Skin: Warm and dry, no jaundice.   Psych: Alert and cooperative, normal mood and affect.  Labs:  Lab Results  Component Value Date   ALT 18 08/31/2016   AST 27 08/31/2016    ALKPHOS 51 08/31/2016   BILITOT 0.3 08/31/2016     Imaging Studies: No results found.

## 2016-09-24 NOTE — Patient Instructions (Signed)
1. You will need labs and ultrasound done in 11/2016. We will make arrangements closer to that time. 2. No plans to discuss colon cancer screening with you per your request. Please feel free to ask any questions or address any concerns that may arrive in the future.

## 2016-09-26 NOTE — Assessment & Plan Note (Signed)
She has completed 12 weeks of Harvoni for HCV genotype 1a. F3/F4 score on Prometheus fibrospect. Plan to update U/S of liver along with elastography in 11/2016. Will be due for labs at that time. If she continues to have significant fibrosis we will plan to manage as cirrhosis care. Discussed at length with patient regarding need for ongoing management of liver even though HCV eradicated. She voiced understanding.

## 2016-09-27 NOTE — Progress Notes (Signed)
CC'D TO PCP °

## 2016-10-06 ENCOUNTER — Other Ambulatory Visit: Payer: Self-pay

## 2016-10-06 DIAGNOSIS — R7989 Other specified abnormal findings of blood chemistry: Secondary | ICD-10-CM

## 2016-10-06 DIAGNOSIS — B182 Chronic viral hepatitis C: Secondary | ICD-10-CM

## 2016-10-06 DIAGNOSIS — R945 Abnormal results of liver function studies: Principal | ICD-10-CM

## 2016-10-14 ENCOUNTER — Other Ambulatory Visit (HOSPITAL_COMMUNITY): Payer: Self-pay | Admitting: Psychiatry

## 2016-11-10 ENCOUNTER — Ambulatory Visit (INDEPENDENT_AMBULATORY_CARE_PROVIDER_SITE_OTHER): Payer: BLUE CROSS/BLUE SHIELD | Admitting: Psychiatry

## 2016-11-10 ENCOUNTER — Encounter (HOSPITAL_COMMUNITY): Payer: Self-pay | Admitting: Psychiatry

## 2016-11-10 VITALS — BP 167/102 | HR 70 | Ht 66.0 in | Wt 137.2 lb

## 2016-11-10 DIAGNOSIS — F411 Generalized anxiety disorder: Secondary | ICD-10-CM | POA: Diagnosis not present

## 2016-11-10 DIAGNOSIS — F332 Major depressive disorder, recurrent severe without psychotic features: Secondary | ICD-10-CM

## 2016-11-10 DIAGNOSIS — Z818 Family history of other mental and behavioral disorders: Secondary | ICD-10-CM

## 2016-11-10 DIAGNOSIS — F1010 Alcohol abuse, uncomplicated: Secondary | ICD-10-CM | POA: Diagnosis not present

## 2016-11-10 MED ORDER — FLUOXETINE HCL 20 MG PO CAPS: 20.0000 mg | ORAL_CAPSULE | Freq: Every day | ORAL | 3 refills | Status: DC

## 2016-11-10 MED ORDER — ALPRAZOLAM 1 MG PO TABS: 1.0000 mg | ORAL_TABLET | Freq: Two times a day (BID) | ORAL | 3 refills | Status: DC
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
## 2016-11-19 ENCOUNTER — Other Ambulatory Visit: Payer: Self-pay

## 2016-11-19 DIAGNOSIS — B182 Chronic viral hepatitis C: Secondary | ICD-10-CM

## 2016-11-19 DIAGNOSIS — R7989 Other specified abnormal findings of blood chemistry: Secondary | ICD-10-CM

## 2016-11-19 DIAGNOSIS — R945 Abnormal results of liver function studies: Principal | ICD-10-CM

## 2016-12-03 ENCOUNTER — Other Ambulatory Visit: Payer: Self-pay | Admitting: Gastroenterology

## 2016-12-03 ENCOUNTER — Telehealth: Payer: Self-pay

## 2016-12-03 LAB — HEPATIC FUNCTION PANEL
ALBUMIN: 4.3 g/dL (ref 3.6–5.1)
ALT: 17 U/L (ref 6–29)
AST: 26 U/L (ref 10–35)
Alkaline Phosphatase: 50 U/L (ref 33–130)
BILIRUBIN INDIRECT: 0.3 mg/dL (ref 0.2–1.2)
Bilirubin, Direct: 0.1 mg/dL (ref <=0.2)
TOTAL PROTEIN: 7.1 g/dL (ref 6.1–8.1)
Total Bilirubin: 0.4 mg/dL (ref 0.2–1.2)

## 2016-12-03 NOTE — Telephone Encounter (Signed)
I received a call from [place] at [facility], saying pt is there to do her labs and she has [facility] orders, and they just have to make sure that it is OK for them to do the labs if pt has other orders.   I told her that is fine, but pt had specifically asked for [facility] orders when she was at the last OV is the reason they were printed for [facility].   She is aware OK to do there.

## 2016-12-06 LAB — HEPATITIS C RNA QUANTITATIVE
HCV QUANT LOG: NOT DETECTED {Log_IU}/mL
HCV Quantitative: <15 IU/mL

## 2016-12-06 NOTE — Telephone Encounter (Signed)
Noted. They are in EPIC> Thanks.

## 2016-12-08 NOTE — Progress Notes (Signed)
LMOM to call.

## 2016-12-08 NOTE — Progress Notes (Signed)
hcv rna negative and lfts normal. Check lfts, hcv rna in six months. She is due for abd u/s with elastography any time she is ready. Please schedule. Dx: h/o chronic hcv, fibrosis.

## 2016-12-09 ENCOUNTER — Other Ambulatory Visit: Payer: Self-pay

## 2016-12-09 DIAGNOSIS — B182 Chronic viral hepatitis C: Secondary | ICD-10-CM

## 2016-12-09 NOTE — Progress Notes (Signed)
PT is aware. OK to schedule the [place].

## 2016-12-15 ENCOUNTER — Telehealth: Payer: Self-pay

## 2016-12-15 NOTE — Telephone Encounter (Signed)
I called patient because she had questions about an [place]. She did not answer

## 2016-12-16 NOTE — Telephone Encounter (Signed)
Pt called back just to make sure that we was aware that she canceled her [place]. She will reschedule it later

## 2016-12-17 ENCOUNTER — Ambulatory Visit (HOSPITAL_COMMUNITY): Payer: BLUE CROSS/BLUE SHIELD

## 2017-02-28 ENCOUNTER — Other Ambulatory Visit (HOSPITAL_COMMUNITY): Payer: Self-pay | Admitting: Psychiatry

## 2017-03-01 ENCOUNTER — Encounter (HOSPITAL_COMMUNITY): Payer: Self-pay | Admitting: Psychiatry

## 2017-03-01 ENCOUNTER — Ambulatory Visit (INDEPENDENT_AMBULATORY_CARE_PROVIDER_SITE_OTHER): Payer: BLUE CROSS/BLUE SHIELD | Admitting: Psychiatry

## 2017-03-01 VITALS — BP 130/90 | HR 81 | Ht 66.0 in | Wt 134.0 lb

## 2017-03-01 DIAGNOSIS — F411 Generalized anxiety disorder: Secondary | ICD-10-CM

## 2017-03-01 DIAGNOSIS — F1010 Alcohol abuse, uncomplicated: Secondary | ICD-10-CM | POA: Diagnosis not present

## 2017-03-01 DIAGNOSIS — Z79899 Other long term (current) drug therapy: Secondary | ICD-10-CM | POA: Diagnosis not present

## 2017-03-01 DIAGNOSIS — F332 Major depressive disorder, recurrent severe without psychotic features: Secondary | ICD-10-CM

## 2017-03-01 DIAGNOSIS — G40909 Epilepsy, unspecified, not intractable, without status epilepticus: Secondary | ICD-10-CM | POA: Diagnosis not present

## 2017-03-01 DIAGNOSIS — Z818 Family history of other mental and behavioral disorders: Secondary | ICD-10-CM | POA: Diagnosis not present

## 2017-03-01 MED ORDER — ALPRAZOLAM 1 MG PO TABS: 1.0000 mg | ORAL_TABLET | Freq: Two times a day (BID) | ORAL | 3 refills | Status: DC

## 2017-03-01 MED ORDER — FLUOXETINE HCL 20 MG PO CAPS: 20.0000 mg | ORAL_CAPSULE | Freq: Every day | ORAL | 3 refills | Status: DC
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
## 2017-05-05 ENCOUNTER — Other Ambulatory Visit: Payer: Self-pay

## 2017-05-05 DIAGNOSIS — B182 Chronic viral hepatitis C: Secondary | ICD-10-CM

## 2017-05-24 ENCOUNTER — Other Ambulatory Visit (HOSPITAL_COMMUNITY): Payer: Self-pay | Admitting: Internal Medicine

## 2017-05-24 DIAGNOSIS — Z1231 Encounter for screening mammogram for malignant neoplasm of breast: Secondary | ICD-10-CM

## 2017-05-26 LAB — HEPATITIS C RNA QUANTITATIVE
HCV Quantitative Log: <1.18 Log IU/mL
HCV RNA, PCR, QN: <15 IU/mL

## 2017-05-26 LAB — HEPATIC FUNCTION PANEL
AG RATIO: 1.7 (calc) (ref 1.0–2.5)
ALT: 17 U/L (ref 6–29)
AST: 27 U/L (ref 10–35)
Albumin: 4.4 g/dL (ref 3.6–5.1)
Alkaline phosphatase (APISO): 56 U/L (ref 33–130)
BILIRUBIN DIRECT: 0.1 mg/dL (ref 0.0–0.2)
BILIRUBIN INDIRECT: 0.4 mg/dL (ref 0.2–1.2)
BILIRUBIN TOTAL: 0.5 mg/dL (ref 0.2–1.2)
GLOBULIN: 2.6 g/dL (ref 1.9–3.7)
Total Protein: 7 g/dL (ref 6.1–8.1)

## 2017-05-27 ENCOUNTER — Ambulatory Visit (HOSPITAL_COMMUNITY)
Admission: RE | Admit: 2017-05-27 | Discharge: 2017-05-27 | Disposition: A | Discharge status: Home / Self Care | Payer: BLUE CROSS/BLUE SHIELD | Source: Ambulatory Visit | Attending: Internal Medicine | Admitting: Internal Medicine

## 2017-05-27 DIAGNOSIS — Z1231 Encounter for screening mammogram for malignant neoplasm of breast: Secondary | ICD-10-CM | POA: Insufficient documentation

## 2017-06-06 NOTE — Progress Notes (Signed)
PT is aware and wants to think about it and let us know what she wishes today. She said she had her Hep A and Hep B at [facility]. She completed the 3rd Hep B in March.

## 2017-06-06 NOTE — Progress Notes (Signed)
Please let patient know her LFTs and HCV are negative. She is cured from hep C. No need to repeat HCV RNA unless she has known exposure or risky behavior.   She had F3/F4 on promethius labs. Patient did not have her u/s with elastography as ordered few months back. Would suggest having it done OR we can manage her as a early cirrhotic based on F3/F4 scores which would involve (liver u/s and labs every 6 months, vaccinate for hep a/b, esophageal variceal screening via EGD).

## 2017-06-23 ENCOUNTER — Ambulatory Visit (INDEPENDENT_AMBULATORY_CARE_PROVIDER_SITE_OTHER): Payer: BLUE CROSS/BLUE SHIELD | Admitting: Psychiatry

## 2017-06-23 ENCOUNTER — Encounter (HOSPITAL_COMMUNITY): Payer: Self-pay | Admitting: Psychiatry

## 2017-06-23 VITALS — BP 164/92 | HR 73 | Ht 66.0 in | Wt 133.0 lb

## 2017-06-23 DIAGNOSIS — G4700 Insomnia, unspecified: Secondary | ICD-10-CM | POA: Diagnosis not present

## 2017-06-23 DIAGNOSIS — F1021 Alcohol dependence, in remission: Secondary | ICD-10-CM

## 2017-06-23 DIAGNOSIS — F411 Generalized anxiety disorder: Secondary | ICD-10-CM

## 2017-06-23 DIAGNOSIS — Z62811 Personal history of psychological abuse in childhood: Secondary | ICD-10-CM

## 2017-06-23 DIAGNOSIS — F332 Major depressive disorder, recurrent severe without psychotic features: Secondary | ICD-10-CM

## 2017-06-23 DIAGNOSIS — Z560 Unemployment, unspecified: Secondary | ICD-10-CM | POA: Diagnosis not present

## 2017-06-23 DIAGNOSIS — Z818 Family history of other mental and behavioral disorders: Secondary | ICD-10-CM

## 2017-06-23 MED ORDER — ALPRAZOLAM 1 MG PO TABS: 1.0000 mg | ORAL_TABLET | Freq: Two times a day (BID) | ORAL | 3 refills | Status: DC

## 2017-06-23 MED ORDER — FLUOXETINE HCL 20 MG PO CAPS: 20.0000 mg | ORAL_CAPSULE | Freq: Every day | ORAL | 3 refills | Status: DC

## 2017-06-23 NOTE — Progress Notes (Signed)
BH MD/PA/NP OP Progress Note  06/23/2017 11:49 AM Maria SCHELLER  MRN:  [id number]  Chief Complaint:  Chief Complaint    Depression; Anxiety; Follow-up     HPI: The patient returns for follow-up after 3 months. She continues on Prozac for treatment of depression and thinks this is going quite well. Her mood is good she is getting out with friends and family and staying active primarily working in her yard. At times she has trouble sleeping but for the most part she sleeps well. She's not drinking at all and has had no further seizures. She denies any significant anxiety symptoms or thoughts of self-harm or suicide Visit Diagnosis:    ICD-10-CM   1. Generalized anxiety disorder F41.1     Past Psychiatric History:   this patient is a 66 year old divorced white female who lives alone in [place]. She has no children. She is currently unemployed but is living off an inheritance. She used to run an Furniture conservator/restorer.  The patient was referred by Dr. Apolonio Schneiders her psychologist for further treatment of depression and anxiety.  The patient states that she has been depressed and drinking heavily since age 6. She was up to about 12 beers a day. When she was in her 30s she saw a psychologist and psychiatrist in [place] who were very helpful and she started on Prozac. She stopped the Prozac after number of years because it was no longer working. The patient states that both of her parents were very difficult people her mother was narcissistic and manipulative in her father use to kill her pets in front of her and was very mean and avoidant.  The patient has continued to drink but was admitted to [facility] in 2013 because of alcoholic hepatitis. She had one previous seizure which was probably alcohol related. In 2014 she had continued to drink but admits to days of alcohol and had a grand mal seizure in a grocery store and was treated at [facility]. She has remained on Keppra since then.  After that she stopped drinking for about a year and stayed sober until August 2015. She started drinking again but stopped again on January 1 of this year.  The patient restarted drinking last fall because she had severe insomnia. Her doctor tried trazodone which causes nightmares and then Ambien which made her feel strange. She finally went back to drinking but wanted to stop and he prescribed Xanax. This is working out very well for her anxiety and insomnia. However she is still depressed. 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 She feels embarrassed and ashamed about relapsing into drinking and having to lose the shelter. She is to have a lot of friends but has given up on other people and spends most of her time alone. She is not close to family as both parents are deceased and her brother lives in [place]. She has 3 dogs. She stays busy but has difficulty concentrating on reading. She's anxious a good deal of the time her mood is low and she's unmotivated to do anything like fix up her house. She claims that she "hates myself" but denies ever wanting to kill herself. 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 However she claims she no longer has the urge to drink. She is initially very  reluctant to go back on antidepressants but I explained to her that she has numerous symptoms of depression that would benefit and then she would agree  Past Medical History:  Past Medical History:  Diagnosis Date  . Acute alcoholic pancreatitis 05/13/2012  . Alcohol abuse 05/13/2012  . Chronic hepatitis C (HCC)    eradicated with Harvoni 2017/2018  . Depression   . History of seizures 05/13/2012  . Hypoglycemia   . Hyponatremia 05/13/2012  . Macrocytic Anemia 05/13/2012  . Raynaud's syndrome   . Seizures (HCC)     Past Surgical History:  Procedure Laterality Date  .  RHINOPLASTY    . TUBAL LIGATION      Family Psychiatric History: Father has a history of depression  Family History:  Family History  Problem Relation Age of Onset  . Colon cancer Mother        [id number]  . Depression Father   . Cervical cancer Maternal Aunt     Social History:  Social History   Social History  . Marital status: Divorced    Spouse name: N/A  . Number of children: N/A  . Years of education: N/A   Social History Main Topics  . Smoking status: Never Smoker  . Smokeless tobacco: Never Used  . Alcohol use No     Comment: history of heavy etoh abuse for 30+ years, but quit in 2015 per patient  . Drug use: No  . Sexual activity: Yes    Birth control/ protection: Surgical   Other Topics Concern  . Not on file   Social History Narrative  . No narrative on file    Allergies: No Known Allergies  Metabolic Disorder Labs: No results found for: HGBA1C, MPG No results found for: PROLACTIN Lab Results  Component Value Date   TRIG 37 05/13/2012   Lab Results  Component Value Date   TSH 1.162 05/12/2012    Therapeutic Level Labs: No results found for: LITHIUM No results found for: VALPROATE No components found for:  CBMZ  Current Medications: Current Outpatient Prescriptions  Medication Sig Dispense Refill  . ALPRAZolam (XANAX) 1 MG tablet Take 1 tablet (1 mg total) by mouth 2 (two) times daily. 60 tablet 3  . FLUoxetine (PROZAC) 20 MG capsule Take 1 capsule (20 mg total) by mouth daily. 30 capsule 3  . levETIRAcetam (KEPPRA) 500 MG tablet Take 500 mg by mouth 2 (two) times daily.    Marland Kitchen. lisinopril (PRINIVIL,ZESTRIL) 10 MG tablet Take 10 mg by mouth daily.  4  . Multiple Vitamin (MULTIVITAMIN WITH MINERALS) TABS Take 1 tablet by mouth daily.     No current facility-administered medications for this visit.      Musculoskeletal: Strength & Muscle Tone: within normal limits Gait & Station: normal Patient leans: N/A  Psychiatric Specialty Exam: Review  of Systems  Psychiatric/Behavioral: The patient has insomnia.   All other systems reviewed and are negative.   Blood pressure (!) 164/92, pulse 73, height 5\' 6"  (1.676 m), weight 133 lb (60.3 kg).Body mass index is 21.47 kg/m.  General Appearance: Casual and Fairly Groomed  Eye Contact:  Good  Speech:  Clear and Coherent  Volume:  Normal  Mood:  Euthymic  Affect:  Appropriate  Thought Process:  Goal Directed  Orientation:  Full (Time, Place, and Person)  Thought Content: WDL   Suicidal Thoughts:  No  Homicidal Thoughts:  No  Memory:  Immediate;   Good Recent;   Good Remote;   Good  Judgement:  Good  Insight:  Good  Psychomotor Activity:  Normal  Concentration:  Concentration: Good and Attention Span: Good  Recall:  Good  Fund of Knowledge: Good  Language: Good  Akathisia:  No  Handed:  Right  AIMS (if indicated): not done  Assets:  Communication Skills Desire for Improvement Physical Health Resilience Social Support Talents/Skills  ADL's:  Intact  Cognition: WNL  Sleep:  Fair   Screenings:   Assessment and Plan: Patient is a 67 year old white female who has a history of alcohol abuse, currently in remission depression anxiety and insomnia. Her depression and anxiety are well controlled with Prozac so this will be continued. Xanax also helps with her anxiety and she has never abused it so this will be continued as well. She'll return to see me in 4 months   Diannia Ruder, MD 06/23/2017, 11:49 AM

## 2017-08-08 NOTE — Progress Notes (Signed)
Offer patient follow up visit in 2 months.

## 2017-08-10 NOTE — Progress Notes (Signed)
I called pt and she declined office visit. Will call if needed.

## 2017-09-13 ENCOUNTER — Other Ambulatory Visit (HOSPITAL_COMMUNITY): Payer: Self-pay | Admitting: Psychiatry

## 2017-10-06 ENCOUNTER — Encounter (HOSPITAL_COMMUNITY): Payer: Self-pay | Admitting: Psychiatry

## 2017-10-06 ENCOUNTER — Ambulatory Visit (HOSPITAL_COMMUNITY): Payer: BLUE CROSS/BLUE SHIELD | Admitting: Psychiatry

## 2017-10-06 VITALS — BP 152/81 | HR 70 | Ht 66.0 in | Wt 132.0 lb

## 2017-10-06 DIAGNOSIS — Z560 Unemployment, unspecified: Secondary | ICD-10-CM

## 2017-10-06 DIAGNOSIS — F411 Generalized anxiety disorder: Secondary | ICD-10-CM

## 2017-10-06 DIAGNOSIS — G4700 Insomnia, unspecified: Secondary | ICD-10-CM

## 2017-10-06 DIAGNOSIS — F1011 Alcohol abuse, in remission: Secondary | ICD-10-CM

## 2017-10-06 DIAGNOSIS — Z818 Family history of other mental and behavioral disorders: Secondary | ICD-10-CM

## 2017-10-06 MED ORDER — FLUOXETINE HCL 20 MG PO CAPS: 20.0000 mg | ORAL_CAPSULE | Freq: Every day | ORAL | 3 refills | Status: DC

## 2017-10-06 MED ORDER — ALPRAZOLAM 1 MG PO TABS: 1.0000 mg | ORAL_TABLET | Freq: Three times a day (TID) | ORAL | 3 refills | Status: DC | PRN

## 2017-10-06 NOTE — Progress Notes (Signed)
BH MD/PA/NP OP Progress Note  10/06/2017 10:27 AM Maria Petty  MRN:  [id number]  Chief Complaint:  Chief Complaint    Depression; Anxiety; Follow-up     HPI: this patient is a 67 year old divorced white female who lives alone in [place]. She has no children. She is currently unemployed but is living off an inheritance. She used to run an Furniture conservator/restorer.  The patient was referred by Dr. Apolonio Schneiders her psychologist for further treatment of depression and anxiety.  The patient states that she has been depressed and drinking heavily since age 3. She was up to about 12 beers a day. When she was in her 30s she saw a psychologist and psychiatrist in [place] who were very helpful and she started on Prozac. She stopped the Prozac after number of years because it was no longer working. The patient states that both of her parents were very difficult people her mother was narcissistic and manipulative in her father use to kill her pets in front of her and was very mean and avoidant.  The patient has continued to drink but was admitted to [facility] in 2013 because of alcoholic hepatitis. She had one previous seizure which was probably alcohol related. In 2014 she had continued to drink but admits to days of alcohol and had a grand mal seizure in a grocery store and was treated at [facility]. She has remained on Keppra since then. After that she stopped drinking for about a year and stayed sober until August 2015. She started drinking again but stopped again on January 1 of this year.  The patient restarted drinking last fall because she had severe insomnia. Her doctor tried trazodone which causes nightmares and then Ambien which made her feel strange. She finally went back to drinking but wanted to stop and he prescribed Xanax. This is working out very well for her anxiety and insomnia. However she is still depressed. 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 She feels embarrassed and ashamed about relapsing into drinking and having to lose the shelter. She is to have a lot of friends but has given up on other people and spends most of her time alone. She is not close to family as both parents are deceased and her brother lives in [place]. She has 3 dogs. She stays busy but has difficulty concentrating on reading. She's anxious a good deal of the time her mood is low and she's unmotivated to do anything like fix up her house. She claims that she "hates myself" but denies ever wanting to kill herself. 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 However she claims she no longer has the urge to drink. She is initially very reluctant to go back on antidepressants but I explained to her that she has numerous symptoms of depression that would benefit and then she would agree  Patient returns after 4 months.  She states that for the most part she is doing well denies being depressed.  She is not drinking at all.  She sees a friend every day for breakfast and she seems to be more social.  She is spending most of her time painting the inside of her house right now.  She states that she has had a lot of trouble sleeping she usually takes one  Xanax at bedtime but often wakes up through the night and wishes she had another 1 to take.  She would like to go up to 3 a day.  I am a little reluctant to do this but she claims that she will use it responsibly and only if necessary.  She states that when she cannot sleep she lays there and worries about why she cannot sleep.  She is tried several other sleeping medications and they do not work or make her feel bad Visit Diagnosis:    ICD-10-CM   1. Generalized anxiety disorder F41.1     Past Psychiatric History: none  Past Medical History:  Past Medical History:  Diagnosis Date  . Acute alcoholic pancreatitis 05/13/2012   . Alcohol abuse 05/13/2012  . Chronic hepatitis C (HCC)    eradicated with Harvoni 2017/2018  . Depression   . History of seizures 05/13/2012  . Hypoglycemia   . Hyponatremia 05/13/2012  . Macrocytic Anemia 05/13/2012  . Raynaud's syndrome   . Seizures (HCC)     Past Surgical History:  Procedure Laterality Date  . RHINOPLASTY    . TUBAL LIGATION      Family Psychiatric History: See below  Family History:  Family History  Problem Relation Age of Onset  . Colon cancer Mother        27  . Depression Father   . Cervical cancer Maternal Aunt     Social History:  Social History   Socioeconomic History  . Marital status: Divorced    Spouse name: None  . Number of children: None  . Years of education: None  . Highest education level: None  Social Needs  . Financial resource strain: None  . Food insecurity - worry: None  . Food insecurity - inability: None  . Transportation needs - medical: None  . Transportation needs - non-medical: None  Occupational History  . None  Tobacco Use  . Smoking status: Never Smoker  . Smokeless tobacco: Never Used  Substance and Sexual Activity  . Alcohol use: No    Alcohol/week: 3.6 oz    Types: 6 Cans of beer per week    Comment: history of heavy etoh abuse for 30+ years, but quit in 2015 per patient  . Drug use: No  . Sexual activity: Yes    Birth control/protection: Surgical  Other Topics Concern  . None  Social History Narrative  . None    Allergies: No Known Allergies  Metabolic Disorder Labs: No results found for: HGBA1C, MPG No results found for: PROLACTIN Lab Results  Component Value Date   TRIG 37 05/13/2012   Lab Results  Component Value Date   TSH 1.162 05/12/2012    Therapeutic Level Labs: No results found for: LITHIUM No results found for: VALPROATE No components found for:  CBMZ  Current Medications: Current Outpatient Medications  Medication Sig Dispense Refill  . ALPRAZolam (XANAX) 1 MG tablet  Take 1 tablet (1 mg total) by mouth 3 (three) times daily as needed for anxiety. 90 tablet 3  . FLUoxetine (PROZAC) 20 MG capsule Take 1 capsule (20 mg total) by mouth daily. 30 capsule 3  . levETIRAcetam (KEPPRA) 500 MG tablet Take 500 mg by mouth 2 (two) times daily.    Marland Kitchen lisinopril (PRINIVIL,ZESTRIL) 10 MG tablet Take 10 mg by mouth daily.  4  . Multiple Vitamin (MULTIVITAMIN WITH MINERALS) TABS Take 1 tablet by mouth daily.     No current facility-administered medications for this visit.  Musculoskeletal: Strength & Muscle Tone: within normal limits Gait & Station: normal Patient leans: N/A  Psychiatric Specialty Exam: Review of Systems  Psychiatric/Behavioral: The patient has insomnia.   All other systems reviewed and are negative.   Blood pressure (!) 152/81, pulse 70, height 5\' 6"  (1.676 m), weight 132 lb (59.9 kg), SpO2 100 %.Body mass index is 21.31 kg/m.  General Appearance: Casual and Fairly Groomed  Eye Contact:  Good  Speech:  Clear and Coherent  Volume:  Normal  Mood:  Anxious  Affect:  Congruent  Thought Process:  Goal Directed  Orientation:  Full (Time, Place, and Person)  Thought Content: Rumination   Suicidal Thoughts:  No  Homicidal Thoughts:  No  Memory:  Immediate;   Good Recent;   Good Remote;   Good  Judgement:  Fair  Insight:  Fair  Psychomotor Activity:  Normal  Concentration:  Concentration: Good and Attention Span: Good  Recall:  Good  Fund of Knowledge: Good  Language: Good  Akathisia:  No  Handed:  Right  AIMS (if indicated): not done  Assets:  Communication Skills Desire for Improvement Physical Health Resilience Social Support Talents/Skills  ADL's:  Intact  Cognition: WNL  Sleep:  Poor   Screenings:   Assessment and Plan: This patient is a 67 year old female with a history of alcohol dependence now in remission as well as depression and anxiety.  Her depression is well controlled with Prozac 20 mg daily.  She asked for us  while increase in Xanax them reluctantly agreeing to do this today she will go up to Xanax 1 mg up to 3 times daily but only as needed.  I have urged her to try to stay with 2 mg a day as much as possible.  She will only use the third Xanax if she absolutely cannot sleep.  She will return to see me in 4 months   Diannia Rudereborah Korver Graybeal, MD 10/06/2017, 10:27 AM

## 2017-10-11 ENCOUNTER — Ambulatory Visit (HOSPITAL_COMMUNITY): Payer: Self-pay | Admitting: Psychiatry

## 2018-01-19 ENCOUNTER — Ambulatory Visit (HOSPITAL_COMMUNITY): Payer: Self-pay | Admitting: Psychiatry

## 2018-01-25 ENCOUNTER — Ambulatory Visit (HOSPITAL_COMMUNITY): Payer: BLUE CROSS/BLUE SHIELD | Admitting: Psychiatry

## 2018-01-25 ENCOUNTER — Encounter (HOSPITAL_COMMUNITY): Payer: Self-pay | Admitting: Psychiatry

## 2018-01-25 VITALS — BP 146/81 | HR 72 | Ht 66.0 in | Wt 132.0 lb

## 2018-01-25 DIAGNOSIS — Z560 Unemployment, unspecified: Secondary | ICD-10-CM | POA: Diagnosis not present

## 2018-01-25 DIAGNOSIS — F411 Generalized anxiety disorder: Secondary | ICD-10-CM | POA: Diagnosis not present

## 2018-01-25 DIAGNOSIS — F1010 Alcohol abuse, uncomplicated: Secondary | ICD-10-CM | POA: Diagnosis not present

## 2018-01-25 DIAGNOSIS — Z818 Family history of other mental and behavioral disorders: Secondary | ICD-10-CM | POA: Diagnosis not present

## 2018-01-25 DIAGNOSIS — F332 Major depressive disorder, recurrent severe without psychotic features: Secondary | ICD-10-CM | POA: Diagnosis not present

## 2018-01-25 DIAGNOSIS — R450 Nervousness: Secondary | ICD-10-CM

## 2018-01-25 MED ORDER — ALPRAZOLAM 1 MG PO TABS: 1.0000 mg | ORAL_TABLET | Freq: Three times a day (TID) | ORAL | 3 refills | Status: DC | PRN

## 2018-01-25 MED ORDER — FLUOXETINE HCL 20 MG PO CAPS: 20.0000 mg | ORAL_CAPSULE | Freq: Every day | ORAL | 3 refills | Status: DC

## 2018-01-25 NOTE — Progress Notes (Signed)
BH MD/PA/NP OP Progress Note  01/25/2018 10:19 AM Maria Petty  MRN:  [id number]  Chief Complaint:  Chief Complaint    Depression; Anxiety; Follow-up     HPI: this patient is a 67 year old divorced white female who lives alone in [place]. She has no children. She is currently unemployed but is living off an inheritance. She used to run an Furniture conservator/restorer.  The patient was referred by Dr. Apolonio Schneiders her psychologist for further treatment of depression and anxiety.  The patient states that she has been depressed and drinking heavily since age 43. She was up to about 12 beers a day. When she was in her 30s she saw a psychologist and psychiatrist in [place] who were very helpful and she started on Prozac. She stopped the Prozac after number of years because it was no longer working. The patient states that both of her parents were very difficult people her mother was narcissistic and manipulative in her father use to kill her pets in front of her and was very mean and avoidant.  The patient has continued to drink but was admitted to [facility] in 2013 because of alcoholic hepatitis. She had one previous seizure which was probably alcohol related. In 2014 she had continued to drink but admits to days of alcohol and had a grand mal seizure in a grocery store and was treated at [facility]. She has remained on Keppra since then. After that she stopped drinking for about a year and stayed sober until August 2015. She started drinking again but stopped again on January 1 of this year.  The patient restarted drinking last fall because she had severe insomnia. Her doctor tried trazodone which causes nightmares and then Ambien which made her feel strange. She finally went back to drinking but wanted to stop and he prescribed Xanax. This is working out very well for her anxiety and insomnia. However she is still depressed. 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 She feels embarrassed and ashamed about relapsing into drinking and having to lose the shelter. She is to have a lot of friends but has given up on other people and spends most of her time alone. She is not close to family as both parents are deceased and her brother lives in [place]. She has 3 dogs. She stays busy but has difficulty concentrating on reading. She's anxious a good deal of the time her mood is low and she's unmotivated to do anything like fix up her house. She claims that she "hates myself" but denies ever wanting to kill herself. 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 However she claims she no longer has the urge to drink. She is initially very reluctant to go back on antidepressants but I explained to her that she has numerous symptoms of depression that would benefit and then she would agree  The patient returns after 4 months.  Last time we increased her Xanax and she claims is working well.  She takes 1 mg at night and sometimes has to take another one if she wakes up through the night.  She still has a fair amount of anxiety and it helps to have one available if she needs it during the day.  She states that she is spending time with friends working in  her garden and is planning a few day trips for the summer.  She is no longer drinking.  She still feels that the Prozac has helped her depression. Visit Diagnosis:    ICD-10-CM   1. Generalized anxiety disorder F41.1     Past Psychiatric History: none  Past Medical History:  Past Medical History:  Diagnosis Date  . Acute alcoholic pancreatitis 05/13/2012  . Alcohol abuse 05/13/2012  . Chronic hepatitis C (HCC)    eradicated with Harvoni 2017/2018  . Depression   . History of seizures 05/13/2012  . Hypoglycemia   . Hyponatremia 05/13/2012  . Macrocytic Anemia 05/13/2012  . Raynaud's syndrome   . Seizures (HCC)      Past Surgical History:  Procedure Laterality Date  . RHINOPLASTY    . TUBAL LIGATION      Family Psychiatric History: See below  Family History:  Family History  Problem Relation Age of Onset  . Colon cancer Mother        22  . Depression Father   . Cervical cancer Maternal Aunt     Social History:  Social History   Socioeconomic History  . Marital status: Divorced    Spouse name: Not on file  . Number of children: Not on file  . Years of education: Not on file  . Highest education level: Not on file  Occupational History  . Not on file  Social Needs  . Financial resource strain: Not on file  . Food insecurity:    Worry: Not on file    Inability: Not on file  . Transportation needs:    Medical: Not on file    Non-medical: Not on file  Tobacco Use  . Smoking status: Never Smoker  . Smokeless tobacco: Never Used  Substance and Sexual Activity  . Alcohol use: No    Alcohol/week: 3.6 oz    Types: 6 Cans of beer per week    Comment: history of heavy etoh abuse for 30+ years, but quit in 2015 per patient  . Drug use: No  . Sexual activity: Yes    Birth control/protection: Surgical  Lifestyle  . Physical activity:    Days per week: Not on file    Minutes per session: Not on file  . Stress: Not on file  Relationships  . Social connections:    Talks on phone: Not on file    Gets together: Not on file    Attends religious service: Not on file    Active member of club or organization: Not on file    Attends meetings of clubs or organizations: Not on file    Relationship status: Not on file  Other Topics Concern  . Not on file  Social History Narrative  . Not on file    Allergies: No Known Allergies  Metabolic Disorder Labs: No results found for: HGBA1C, MPG No results found for: PROLACTIN Lab Results  Component Value Date   TRIG 37 05/13/2012   Lab Results  Component Value Date   TSH 1.162 05/12/2012    Therapeutic Level Labs: No results found  for: LITHIUM No results found for: VALPROATE No components found for:  CBMZ  Current Medications: Current Outpatient Medications  Medication Sig Dispense Refill  . ALPRAZolam (XANAX) 1 MG tablet Take 1 tablet (1 mg total) by mouth 3 (three) times daily as needed for anxiety. 90 tablet 3  . FLUoxetine (PROZAC) 20 MG capsule Take 1 capsule (20 mg total) by mouth daily. 30 capsule  3  . levETIRAcetam (KEPPRA) 500 MG tablet Take 500 mg by mouth 2 (two) times daily.    Marland Kitchen lisinopril (PRINIVIL,ZESTRIL) 40 MG tablet Take 40 mg by mouth daily.    . Multiple Vitamin (MULTIVITAMIN WITH MINERALS) TABS Take 1 tablet by mouth daily.     No current facility-administered medications for this visit.      Musculoskeletal: Strength & Muscle Tone: within normal limits Gait & Station: normal Patient leans: N/A  Psychiatric Specialty Exam: Review of Systems  Psychiatric/Behavioral: The patient is nervous/anxious.   All other systems reviewed and are negative.   Blood pressure (!) 146/81, pulse 72, height  (1.676 m), weight 132 lb (59.9 kg), SpO2 99 %.Body mass index is 21.31 kg/m.  General Appearance: Casual and Fairly Groomed  Eye Contact:  Good  Speech:  Clear and Coherent  Volume:  Normal  Mood:  Anxious  Affect:  Congruent  Thought Process:  Goal Directed  Orientation:  Full (Time, Place, and Person)  Thought Content: WDL   Suicidal Thoughts:  No  Homicidal Thoughts:  No  Memory:  Immediate;   Good Recent;   Good Remote;   Good  Judgement:  Good  Insight:  Fair  Psychomotor Activity:  Normal  Concentration:  Concentration: Good and Attention Span: Good  Recall:  Good  Fund of Knowledge: Good  Language: Good  Akathisia:  No  Handed:  Right  AIMS (if indicated): not done  Assets:  Communication Skills Desire for Improvement Physical Health Resilience Social Support Talents/Skills  ADL's:  Intact  Cognition: WNL  Sleep:  Fair   Screenings:   Assessment and Plan: This  patient is a 67 year old female with a remote history of alcohol abuse, depression and anxiety.  She is doing much better on her current regimen.  She will continue Prozac 20 mg daily for depression and Xanax 1 mg up to 3 times daily as needed for anxiety and sleep.  She will return to see me in 4 months   Diannia Ruder, MD 01/25/2018, 10:19 AM

## 2018-05-17 ENCOUNTER — Encounter (HOSPITAL_COMMUNITY): Payer: Self-pay | Admitting: Psychiatry

## 2018-05-17 ENCOUNTER — Ambulatory Visit (INDEPENDENT_AMBULATORY_CARE_PROVIDER_SITE_OTHER): Payer: Medicare Other | Admitting: Psychiatry

## 2018-05-17 VITALS — BP 144/84 | HR 72 | Ht 66.0 in | Wt 136.0 lb

## 2018-05-17 DIAGNOSIS — F411 Generalized anxiety disorder: Secondary | ICD-10-CM | POA: Diagnosis not present

## 2018-05-17 MED ORDER — FLUOXETINE HCL 20 MG PO CAPS: 20.0000 mg | ORAL_CAPSULE | Freq: Every day | ORAL | 5 refills | Status: DC

## 2018-05-17 MED ORDER — ALPRAZOLAM 1 MG PO TABS: 1.0000 mg | ORAL_TABLET | Freq: Three times a day (TID) | ORAL | 5 refills | Status: DC | PRN

## 2018-05-17 NOTE — Progress Notes (Signed)
BH MD/PA/NP OP Progress Note  05/17/2018 10:34 AM Maria Petty  MRN:  [id number]  Chief Complaint:  Chief Complaint    Depression; Anxiety; Follow-up     HPI: this patient is a 67 year old divorced white female who lives alone in [place]. She has no children. She is currently unemployed but is living off an inheritance. She used to run an Furniture conservator/restorer.  The patient was referred by Dr. Apolonio Schneiders her psychologist for further treatment of depression and anxiety.  The patient states that she has been depressed and drinking heavily since age 14. She was up to about 12 beers a day. When she was in her 30s she saw a psychologist and psychiatrist in [place] who were very helpful and she started on Prozac. She stopped the Prozac after number of years because it was no longer working. The patient states that both of her parents were very difficult people her mother was narcissistic and manipulative in her father use to kill her pets in front of her and was very mean and avoidant.  The patient has continued to drink but was admitted to [facility] in 2013 because of alcoholic hepatitis. She had one previous seizure which was probably alcohol related. In 2014 she had continued to drink but admits to days of alcohol and had a grand mal seizure in a grocery store and was treated at [facility]. She has remained on Keppra since then. After that she stopped drinking for about a year and stayed sober until August 2015. She started drinking again but stopped again on January 1 of this year.  The patient restarted drinking last fall because she had severe insomnia. Her doctor tried trazodone which causes nightmares and then Ambien which made her feel strange. She finally went back to drinking but wanted to stop and he prescribed Xanax. This is working out very well for her anxiety and insomnia. However she is still depressed. 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 She feels embarrassed and ashamed about relapsing into drinking and having to lose the shelter. She is to have a lot of friends but has given up on other people and spends most of her time alone. She is not close to family as both parents are deceased and her brother lives in [place]. She has 3 dogs. She stays busy but has difficulty concentrating on reading. She's anxious a good deal of the time her mood is low and she's unmotivated to do anything like fix up her house. She claims that she "hates myself" but denies ever wanting to kill herself. 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 However she claims she no longer has the urge to drink. She is initially very reluctant to go back on antidepressants but I explained to her that she has numerous symptoms of depression that would benefit and then she would agree  The patient returns after 4 months.  She states that she had a good summer and went on a trip to [place] to visit a friend.  She spent most of her time taking care of her home and yard.  She does interact some with her neighbors.  She states that for the most part she is sleeping well her anxiety is under good control and she denies depression.  She has had no  further seizures and remains compliant with Keppra.  Her general health is good.  Xanax continues to help her anxiety and she is not returned at all to drinking. Visit Diagnosis:    ICD-10-CM   1. Generalized anxiety disorder F41.1     Past Psychiatric History: none  Past Medical History:  Past Medical History:  Diagnosis Date  . Acute alcoholic pancreatitis 05/13/2012  . Alcohol abuse 05/13/2012  . Chronic hepatitis C (HCC)    eradicated with Harvoni 2017/2018  . Depression   . History of seizures 05/13/2012  . Hypoglycemia   . Hyponatremia 05/13/2012  . Macrocytic Anemia 05/13/2012  . Raynaud's syndrome   . Seizures  (HCC)     Past Surgical History:  Procedure Laterality Date  . RHINOPLASTY    . TUBAL LIGATION      Family Psychiatric History: See below  Family History:  Family History  Problem Relation Age of Onset  . Colon cancer Mother        [id number]  . Depression Father   . Cervical cancer Maternal Aunt     Social History:  Social History   Socioeconomic History  . Marital status: Divorced    Spouse name: Not on file  . Number of children: Not on file  . Years of education: Not on file  . Highest education level: Not on file  Occupational History  . Not on file  Social Needs  . Financial resource strain: Not on file  . Food insecurity:    Worry: Not on file    Inability: Not on file  . Transportation needs:    Medical: Not on file    Non-medical: Not on file  Tobacco Use  . Smoking status: Never Smoker  . Smokeless tobacco: Never Used  Substance and Sexual Activity  . Alcohol use: No    Alcohol/week: 6.0 standard drinks    Types: 6 Cans of beer per week    Comment: history of heavy etoh abuse for 30+ years, but quit in 2015 per patient  . Drug use: No  . Sexual activity: Yes    Birth control/protection: Surgical  Lifestyle  . Physical activity:    Days per week: Not on file    Minutes per session: Not on file  . Stress: Not on file  Relationships  . Social connections:    Talks on phone: Not on file    Gets together: Not on file    Attends religious service: Not on file    Active member of club or organization: Not on file    Attends meetings of clubs or organizations: Not on file    Relationship status: Not on file  Other Topics Concern  . Not on file  Social History Narrative  . Not on file    Allergies: No Known Allergies  Metabolic Disorder Labs: No results found for: HGBA1C, MPG No results found for: PROLACTIN Lab Results  Component Value Date   TRIG 37 05/13/2012   Lab Results  Component Value Date   TSH 1.162 05/12/2012    Therapeutic Level  Labs: No results found for: LITHIUM No results found for: VALPROATE No components found for:  CBMZ  Current Medications: Current Outpatient Medications  Medication Sig Dispense Refill  . ALPRAZolam (XANAX) 1 MG tablet Take 1 tablet (1 mg total) by mouth 3 (three) times daily as needed for anxiety. 90 tablet 5  . FLUoxetine (PROZAC) 20 MG capsule Take 1 capsule (20 mg total) by mouth daily. 30  capsule 5  . levETIRAcetam (KEPPRA) 500 MG tablet Take 500 mg by mouth 2 (two) times daily.    Marland Kitchen lisinopril (PRINIVIL,ZESTRIL) 40 MG tablet Take 40 mg by mouth daily.    . Multiple Vitamin (MULTIVITAMIN WITH MINERALS) TABS Take 1 tablet by mouth daily.     No current facility-administered medications for this visit.      Musculoskeletal: Strength & Muscle Tone: within normal limits Gait & Station: normal Patient leans: N/A  Psychiatric Specialty Exam: Review of Systems  All other systems reviewed and are negative.   Blood pressure (!) 144/84, pulse 72, height 5\' 6"  (1.676 m), weight 136 lb (61.7 kg), SpO2 100 %.Body mass index is 21.95 kg/m.  General Appearance: Casual and Fairly Groomed  Eye Contact:  Good  Speech:  Clear and Coherent  Volume:  Normal  Mood:  Euthymic  Affect:  Congruent  Thought Process:  Goal Directed  Orientation:  Full (Time, Place, and Person)  Thought Content: WDL   Suicidal Thoughts:  No  Homicidal Thoughts:  No  Memory:  Immediate;   Good Recent;   Good Remote;   Good  Judgement:  Good  Insight:  Good  Psychomotor Activity:  Normal  Concentration:  Concentration: Good and Attention Span: Good  Recall:  Good  Fund of Knowledge: Good  Language: Good  Akathisia:  No  Handed:  Right  AIMS (if indicated): not done  Assets:  Communication Skills Desire for Improvement Physical Health Resilience Social Support Talents/Skills  ADL's:  Intact  Cognition: WNL  Sleep:  Good   Screenings:   Assessment and Plan: This patient is a 67 year old female  with a history of depression anxiety and alcohol abuse in remission.  She is doing extremely well on her current regimen.  She will continue Prozac 20 mg daily for depression and Xanax 1 mg 3 times daily as needed for anxiety.  She will return to see me in 6 months.   Diannia Ruder, MD 05/17/2018, 10:34 AM

## 2018-07-05 DIAGNOSIS — Z01419 Encounter for gynecological examination (general) (routine) without abnormal findings: Secondary | ICD-10-CM | POA: Diagnosis not present

## 2018-07-05 DIAGNOSIS — Z6822 Body mass index (BMI) 22.0-22.9, adult: Secondary | ICD-10-CM | POA: Diagnosis not present

## 2018-07-06 DIAGNOSIS — F4323 Adjustment disorder with mixed anxiety and depressed mood: Secondary | ICD-10-CM | POA: Diagnosis not present

## 2018-07-26 DIAGNOSIS — F4323 Adjustment disorder with mixed anxiety and depressed mood: Secondary | ICD-10-CM | POA: Diagnosis not present

## 2018-09-26 ENCOUNTER — Other Ambulatory Visit (HOSPITAL_COMMUNITY): Payer: Self-pay | Admitting: Psychiatry

## 2018-09-26 ENCOUNTER — Telehealth (HOSPITAL_COMMUNITY): Payer: Self-pay | Admitting: *Deleted

## 2018-09-26 MED ORDER — ALPRAZOLAM 1 MG PO TABS: 1.0000 mg | ORAL_TABLET | Freq: Three times a day (TID) | ORAL | 3 refills | Status: DC | PRN

## 2018-09-26 MED ORDER — FLUOXETINE HCL 20 MG PO CAPS
20.0000 mg | ORAL_CAPSULE | Freq: Every day | ORAL | 5 refills | Status: DC
Start: 2018-09-26 — End: 2018-10-18

## 2018-09-26 NOTE — Telephone Encounter (Signed)
sent 

## 2018-09-26 NOTE — Telephone Encounter (Signed)
Dr Tenny Crawoss Patient came in as a walk in to request early refills on he medication.  Xanax & Prozac she will going to [place] in family vacation. Will be leaving early Am on Friday . Requesting to pick-up on Thursday 09/28/2018.

## 2018-10-17 DIAGNOSIS — F4323 Adjustment disorder with mixed anxiety and depressed mood: Secondary | ICD-10-CM | POA: Diagnosis not present

## 2018-10-18 ENCOUNTER — Encounter (HOSPITAL_COMMUNITY): Payer: Self-pay | Admitting: Psychiatry

## 2018-10-18 ENCOUNTER — Ambulatory Visit (INDEPENDENT_AMBULATORY_CARE_PROVIDER_SITE_OTHER): Payer: Medicare Other | Admitting: Psychiatry

## 2018-10-18 VITALS — BP 144/86 | HR 65 | Ht 66.0 in | Wt 136.0 lb

## 2018-10-18 DIAGNOSIS — F411 Generalized anxiety disorder: Secondary | ICD-10-CM | POA: Diagnosis not present

## 2018-10-18 MED ORDER — FLUOXETINE HCL 20 MG PO CAPS: 20.0000 mg | ORAL_CAPSULE | Freq: Every day | ORAL | 5 refills | Status: DC

## 2018-10-18 MED ORDER — LORAZEPAM 1 MG PO TABS: 1.0000 mg | ORAL_TABLET | Freq: Three times a day (TID) | ORAL | 2 refills | Status: DC

## 2018-10-18 NOTE — Progress Notes (Signed)
BH MD/PA/NP OP Progress Note  10/18/2018 11:51 AM Maria Petty  MRN:  [id number]  Chief Complaint:  Chief Complaint    Anxiety; Depression; Follow-up     HPI: this patient is a 68 year old divorced white female who lives alone in [place]. She has no children. She is currently unemployed but is living off an inheritance. She used to run an Furniture conservator/restorer.  The patient was referred by Dr. Apolonio Schneiders her psychologist for further treatment of depression and anxiety.  The patient states that she has been depressed and drinking heavily since age 51. She was up to about 12 beers a day. When she was in her 30s she saw a psychologist and psychiatrist in [place] who were very helpful and she started on Prozac. She stopped the Prozac after number of years because it was no longer working. The patient states that both of her parents were very difficult people her mother was narcissistic and manipulative in her father use to kill her pets in front of her and was very mean and avoidant.  The patient has continued to drink but was admitted to [facility] in 2013 because of alcoholic hepatitis. She had one previous seizure which was probably alcohol related. In 2014 she had continued to drink but admits to days of alcohol and had a grand mal seizure in a grocery store and was treated at [facility]. She has remained on Keppra since then. After that she stopped drinking for about a year and stayed sober until August 2015. She started drinking again but stopped again on January 1 of this year.  The patient restarted drinking last fall because she had severe insomnia. Her doctor tried trazodone which causes nightmares and then Ambien which made her feel strange. She finally went back to drinking but wanted to stop and he prescribed Xanax. This is working out very well for her anxiety and insomnia. However she is still depressed. 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 She feels embarrassed and ashamed about relapsing into drinking and having to lose the shelter. She is to have a lot of friends but has given up on other people and spends most of her time alone. She is not close to family as both parents are deceased and her brother lives in [place]. She has 3 dogs. She stays busy but has difficulty concentrating on reading. She's anxious a good deal of the time her mood is low and she's unmotivated to do anything like fix up her house. She claims that she "hates myself" but denies ever wanting to kill herself. 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 However she claims she no longer has the urge to drink. She is initially very reluctant to go back on antidepressants but I explained to her that she has numerous symptoms of depression that would benefit and then she would agree  The patient returns after 6 months.  We have had some calls with her pharmacy stating that she is always trying to get her Xanax early.  In looking at the controlled substance website it indicates that she is trying to get it about 3 days early every month.  She states she uses extra because she cannot sleep and she also feels like she is gaining tolerance.  I am reluctant to go up  on the dosage because of the new rules regarding controlled drugs and also because of her history of alcohol abuse.  I suggested we switch to a different benzodiazepine because of tolerance and she elects to try Ativan.  I will also give her Belsomra samples.  These may be more helpful and safer to help her with sleep.  She denies being depressed and recently had a great trip to [place] with family members. Visit Diagnosis:    ICD-10-CM   1. Generalized anxiety disorder F41.1     Past Psychiatric History: none  Past Medical History:  Past Medical History:  Diagnosis Date  . Acute alcoholic pancreatitis  05/13/2012  . Alcohol abuse 05/13/2012  . Chronic hepatitis C (HCC)    eradicated with Harvoni 2017/2018  . Depression   . History of seizures 05/13/2012  . Hypoglycemia   . Hyponatremia 05/13/2012  . Macrocytic Anemia 05/13/2012  . Raynaud's syndrome   . Seizures (HCC)     Past Surgical History:  Procedure Laterality Date  . RHINOPLASTY    . TUBAL LIGATION      Family Psychiatric History: See below  Family History:  Family History  Problem Relation Age of Onset  . Colon cancer Mother        26  . Depression Father   . Cervical cancer Maternal Aunt     Social History:  Social History   Socioeconomic History  . Marital status: Divorced    Spouse name: Not on file  . Number of children: Not on file  . Years of education: Not on file  . Highest education level: Not on file  Occupational History  . Not on file  Social Needs  . Financial resource strain: Not on file  . Food insecurity:    Worry: Not on file    Inability: Not on file  . Transportation needs:    Medical: Not on file    Non-medical: Not on file  Tobacco Use  . Smoking status: Never Smoker  . Smokeless tobacco: Never Used  Substance and Sexual Activity  . Alcohol use: No    Alcohol/week: 6.0 standard drinks    Types: 6 Cans of beer per week    Comment: history of heavy etoh abuse for 30+ years, but quit in 2015 per patient  . Drug use: No  . Sexual activity: Yes    Birth control/protection: Surgical  Lifestyle  . Physical activity:    Days per week: Not on file    Minutes per session: Not on file  . Stress: Not on file  Relationships  . Social connections:    Talks on phone: Not on file    Gets together: Not on file    Attends religious service: Not on file    Active member of club or organization: Not on file    Attends meetings of clubs or organizations: Not on file    Relationship status: Not on file  Other Topics Concern  . Not on file  Social History Narrative  . Not on file     Allergies: No Known Allergies  Metabolic Disorder Labs: No results found for: HGBA1C, MPG No results found for: PROLACTIN Lab Results  Component Value Date   TRIG 37 05/13/2012   Lab Results  Component Value Date   TSH 1.162 05/12/2012    Therapeutic Level Labs: No results found for: LITHIUM No results found for: VALPROATE No components found for:  CBMZ  Current Medications: Current Outpatient Medications  Medication  Sig Dispense Refill  . FLUoxetine (PROZAC) 20 MG capsule Take 1 capsule (20 mg total) by mouth daily. 30 capsule 5  . levETIRAcetam (KEPPRA) 500 MG tablet Take 500 mg by mouth 2 (two) times daily.    Marland Kitchen. lisinopril (PRINIVIL,ZESTRIL) 40 MG tablet Take 40 mg by mouth daily.    . Multiple Vitamin (MULTIVITAMIN WITH MINERALS) TABS Take 1 tablet by mouth daily.    Marland Kitchen. LORazepam (ATIVAN) 1 MG tablet Take 1 tablet (1 mg total) by mouth 3 (three) times daily. 90 tablet 2   No current facility-administered medications for this visit.      Musculoskeletal: Strength & Muscle Tone: within normal limits Gait & Station: normal Patient leans: N/A  Psychiatric Specialty Exam: Review of Systems  Psychiatric/Behavioral: The patient is nervous/anxious and has insomnia.   All other systems reviewed and are negative.   Blood pressure (!) 144/86, pulse 65, height 5\' 6"  (1.676 m), weight 136 lb (61.7 kg), SpO2 100 %.Body mass index is 21.95 kg/m.  General Appearance: Casual and Fairly Groomed  Eye Contact:  Good  Speech:  Clear and Coherent  Volume:  Normal  Mood:  Anxious  Affect:  Appropriate and Congruent  Thought Process:  Goal Directed  Orientation:  Full (Time, Place, and Person)  Thought Content: Rumination   Suicidal Thoughts:  No  Homicidal Thoughts:  No  Memory:  Immediate;   Good Recent;   Good Remote;   Good  Judgement:  Good  Insight:  Fair  Psychomotor Activity:  Normal  Concentration:  Concentration: Good and Attention Span: Good  Recall:  Good   Fund of Knowledge: Good  Language: Good  Akathisia:  No  Handed:  Right  AIMS (if indicated): not done  Assets:  Communication Skills Desire for Improvement Physical Health Resilience Social Support Talents/Skills  ADL's:  Intact  Cognition: WNL  Sleep:  Poor   Screenings:   Assessment and Plan: This patient is a 68 year old female with a remote history of alcohol abuse depression and anxiety.  Because she is getting tolerance to Xanax we will switch to Ativan 1 mg 3 times daily.  She will continue Prozac 20 mg daily for depression.  I have given her samples of Belsomra 20 mg to try at bedtime for sleep.  She will return to see me in 6 months but let us know in 3 months how the Ativan is doing so we can renew it if needed.   Diannia Rudereborah , MD 10/18/2018, 11:51 AM

## 2018-10-30 ENCOUNTER — Telehealth (HOSPITAL_COMMUNITY): Payer: Self-pay | Admitting: *Deleted

## 2018-10-30 ENCOUNTER — Other Ambulatory Visit (HOSPITAL_COMMUNITY): Payer: Self-pay | Admitting: Psychiatry

## 2018-10-30 MED ORDER — ALPRAZOLAM 1 MG PO TABS: 1.0000 mg | ORAL_TABLET | Freq: Three times a day (TID) | ORAL | 3 refills | Status: DC | PRN

## 2018-10-30 NOTE — Telephone Encounter (Signed)
Dr Tenny Craw Patient stopped by requesting to go back on Xanax . She did some research & came in with paper work stating that  Lorazepam isn't the equailvant to Alprazolam. She said she would like if possible to go back on Xanax. @ 2 mg 1 tab 2 x daily?

## 2018-10-30 NOTE — Telephone Encounter (Signed)
Patient has been informed of Xanax script

## 2018-10-30 NOTE — Telephone Encounter (Signed)
Her last xanax dosage was 1 mg tid and this is what I have sent in and what I will agree to prescribe

## 2018-11-02 ENCOUNTER — Telehealth (HOSPITAL_COMMUNITY): Payer: Self-pay

## 2018-11-02 ENCOUNTER — Ambulatory Visit (HOSPITAL_COMMUNITY): Payer: Medicare Other | Admitting: Psychiatry

## 2018-11-02 NOTE — Telephone Encounter (Signed)
Called pharmacy back and spoke with Almeta Monas who had previously left the message and informed him that the Lorazepam prescription has been discontinued.  He also said that in that last several months this patient has attempted to get early refills on her controlled substances just FYI.

## 2018-11-02 NOTE — Telephone Encounter (Signed)
yes

## 2018-11-02 NOTE — Telephone Encounter (Signed)
Pharmacy called wanting to know was the Lorazepam prescription discontinued since she was written a prescription for Xanax 1mg ?

## 2019-02-15 ENCOUNTER — Ambulatory Visit (HOSPITAL_COMMUNITY): Payer: Medicare Other | Admitting: Psychiatry

## 2019-02-20 ENCOUNTER — Encounter (HOSPITAL_COMMUNITY): Payer: Self-pay | Admitting: Psychiatry

## 2019-02-20 ENCOUNTER — Other Ambulatory Visit: Payer: Self-pay

## 2019-02-20 ENCOUNTER — Ambulatory Visit (INDEPENDENT_AMBULATORY_CARE_PROVIDER_SITE_OTHER): Payer: Medicare Other | Admitting: Psychiatry

## 2019-02-20 DIAGNOSIS — F411 Generalized anxiety disorder: Secondary | ICD-10-CM | POA: Diagnosis not present

## 2019-02-20 MED ORDER — FLUOXETINE HCL 20 MG PO CAPS: 20.0000 mg | ORAL_CAPSULE | Freq: Every day | ORAL | 5 refills | Status: DC

## 2019-02-20 MED ORDER — ALPRAZOLAM 1 MG PO TABS: 1.0000 mg | ORAL_TABLET | Freq: Three times a day (TID) | ORAL | 5 refills | Status: DC | PRN

## 2019-02-20 NOTE — Progress Notes (Signed)
Virtual Visit via Telephone Note  I connected with Maria LovelyJean E Butzin on 02/20/19 at 11:40 AM EDT by telephone and verified that I am speaking with the correct person using two identifiers.   I discussed the limitations, risks, security and privacy concerns of performing an evaluation and management service by telephone and the availability of in person appointments. I also discussed with the patient that there may be a patient responsible charge related to this service. The patient expressed understanding and agreed to proceed.     I discussed the assessment and treatment plan with the patient. The patient was provided an opportunity to ask questions and all were answered. The patient agreed with the plan and demonstrated an understanding of the instructions.   The patient was advised to call back or seek an in-person evaluation if the symptoms worsen or if the condition fails to improve as anticipated.  I provided 15 minutes of non-face-to-face time during this encounter.   Diannia Rudereborah , MD  [facility] MD/PA/NP OP Progress Note  02/20/2019 11:57 AM Maria Petty  MRN:  [id number]  Chief Complaint:  Chief Complaint    Depression; Anxiety; Follow-up     HPI: this patient is a 68 year old divorced white female who lives alone in [place]. She has no children. She is currently unemployed but is living off an inheritance. 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  The patient was referred by Dr. Apolonio SchneidersJulia Brannan her psychologist for further treatment of depression and anxiety.  The patient states that she has been depressed and drinking heavily since age 68. She was up to about 12 beers a day. When she was in her 30s she saw a psychologist and psychiatrist in [place] who were very helpful and she started on Prozac. She stopped the Prozac after number of years because it was no longer working. The patient states that both of her parents were very difficult people her mother was narcissistic and manipulative in  her father use to kill her pets in front of her and was very mean and avoidant.  The patient has continued to drink but was admitted to [facility] in 2013 because of alcoholic hepatitis. She had one previous seizure which was probably alcohol related. In 2014 she had continued to drink but admits to days of alcohol and had a grand mal seizure in a grocery store and was treated at [facility]. She has remained on Keppra since then. After that she stopped drinking for about a year and stayed sober until August 2015. She started drinking again but stopped again on January 1 of this year.  The patient restarted drinking last fall because she had severe insomnia. Her doctor tried trazodone which causes nightmares and then Ambien which made her feel strange. She finally went back to drinking but wanted to stop and he prescribed Xanax. This is working out very well for her anxiety and insomnia. However she is still depressed. 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 She feels embarrassed and ashamed about relapsing into drinking and having to lose the shelter. She is to have a lot of friends but has given up on other people and spends most of her time alone. She is not close to family as both parents are deceased and her brother lives in [place]. She has 3 dogs. She stays busy but has  difficulty concentrating on reading. She's anxious a good deal of the time her mood is low and she's unmotivated to do anything like fix up her house. She claims that she "hates myself" but denies ever wanting to kill herself. 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 However she claims she no longer has the urge to drink. She is initially very reluctant to go back on antidepressants but I explained to her that she has numerous symptoms of depression that would benefit and then she  would agree  The patient returns after 4 months.  She is assessed via phone due to the coronavirus pandemic.  She states that she is doing very well considering the circumstances.  She is getting out but is wearing her mask and taking precautions.  Her mood has been good and she is sleeping well.  She states that the Xanax continues to help her anxiety.  We had tried Ativan but it did not work.  She denies any thoughts of self-harm or suicidal ideation.  She is no longer drinking. Visit Diagnosis:    ICD-10-CM   1. Generalized anxiety disorder  F41.1     Past Psychiatric History:none  Past Medical History:  Past Medical History:  Diagnosis Date  . Acute alcoholic pancreatitis 05/13/2012  . Alcohol abuse 05/13/2012  . Chronic hepatitis C (HCC)    eradicated with Harvoni 2017/2018  . Depression   . History of seizures 05/13/2012  . Hypoglycemia   . Hyponatremia 05/13/2012  . Macrocytic Anemia 05/13/2012  . Raynaud's syndrome   . Seizures (HCC)     Past Surgical History:  Procedure Laterality Date  . RHINOPLASTY    . TUBAL LIGATION      Family Psychiatric History: see below  Family History:  Family History  Problem Relation Age of Onset  . Colon cancer Mother        188  . Depression Father   . Cervical cancer Maternal Aunt     Social History:  Social History   Socioeconomic History  . Marital status: Divorced    Spouse name: Not on file  . Number of children: Not on file  . Years of education: Not on file  . Highest education level: Not on file  Occupational History  . Not on file  Social Needs  . Financial resource strain: Not on file  . Food insecurity    Worry: Not on file    Inability: Not on file  . Transportation needs    Medical: Not on file    Non-medical: Not on file  Tobacco Use  . Smoking status: Never Smoker  . Smokeless tobacco: Never Used  Substance and Sexual Activity  . Alcohol use: No    Alcohol/week: 6.0 standard drinks    Types: 6 Cans of  beer per week    Comment: history of heavy etoh abuse for 30+ years, but quit in 2015 per patient  . Drug use: No  . Sexual activity: Yes    Birth control/protection: Surgical  Lifestyle  . Physical activity    Days per week: Not on file    Minutes per session: Not on file  . Stress: Not on file  Relationships  . Social Musicianconnections    Talks on phone: Not on file    Gets together: Not on file    Attends religious service: Not on file    Active member of club or organization: Not on file    Attends  meetings of clubs or organizations: Not on file    Relationship status: Not on file  Other Topics Concern  . Not on file  Social History Narrative  . Not on file    Allergies: No Known Allergies  Metabolic Disorder Labs: No results found for: HGBA1C, MPG No results found for: PROLACTIN Lab Results  Component Value Date   TRIG 37 05/13/2012   Lab Results  Component Value Date   TSH 1.162 05/12/2012    Therapeutic Level Labs: No results found for: LITHIUM No results found for: VALPROATE No components found for:  CBMZ  Current Medications: Current Outpatient Medications  Medication Sig Dispense Refill  . ALPRAZolam (XANAX) 1 MG tablet Take 1 tablet (1 mg total) by mouth 3 (three) times daily as needed for anxiety. 90 tablet 5  . FLUoxetine (PROZAC) 20 MG capsule Take 1 capsule (20 mg total) by mouth daily. 30 capsule 5  . levETIRAcetam (KEPPRA) 500 MG tablet Take 500 mg by mouth 2 (two) times daily.    Marland Kitchen lisinopril (PRINIVIL,ZESTRIL) 40 MG tablet Take 40 mg by mouth daily.    . Multiple Vitamin (MULTIVITAMIN WITH MINERALS) TABS Take 1 tablet by mouth daily.     No current facility-administered medications for this visit.      Musculoskeletal: Strength & Muscle Tone: within normal limits Gait & Station: normal Patient leans: N/A  Psychiatric Specialty Exam: Review of Systems  All other systems reviewed and are negative.   There were no vitals taken for this  visit.There is no height or weight on file to calculate BMI.  General Appearance: NA  Eye Contact:  NA  Speech:  Clear and Coherent  Volume:  Normal  Mood:  Euthymic  Affect:  NA  Thought Process:  Goal Directed  Orientation:  Full (Time, Place, and Person)  Thought Content: WDL   Suicidal Thoughts:  No  Homicidal Thoughts:  No  Memory:  Immediate;   Good Recent;   Good Remote;   Good  Judgement:  Good  Insight:  Good  Psychomotor Activity:  Normal  Concentration:  Concentration: Good and Attention Span: Good  Recall:  Good  Fund of Knowledge: Good  Language: Good  Akathisia:  No  Handed:  Right  AIMS (if indicated): not done  Assets:  Communication Skills Desire for Improvement Physical Health Resilience Social Support Talents/Skills  ADL's:  Intact  Cognition: WNL  Sleep:  Good   Screenings:   Assessment and Plan: This patient is a 68 year old female with a history of depression and anxiety.  She is doing well on her current regimen.  She will continue Xanax 1 mg 3 times daily for anxiety and Prozac 20 mg daily for depression.  She will return to see me in 6 months   Levonne Spiller, MD 02/20/2019, 11:57 AM

## 2019-03-26 DIAGNOSIS — I10 Essential (primary) hypertension: Secondary | ICD-10-CM | POA: Diagnosis not present

## 2019-03-26 DIAGNOSIS — F419 Anxiety disorder, unspecified: Secondary | ICD-10-CM | POA: Diagnosis not present

## 2019-03-28 ENCOUNTER — Ambulatory Visit (HOSPITAL_COMMUNITY): Payer: Medicare Other | Admitting: Psychiatry

## 2019-03-29 DIAGNOSIS — Z0000 Encounter for general adult medical examination without abnormal findings: Secondary | ICD-10-CM | POA: Diagnosis not present

## 2019-03-29 DIAGNOSIS — I10 Essential (primary) hypertension: Secondary | ICD-10-CM | POA: Diagnosis not present

## 2019-07-18 ENCOUNTER — Other Ambulatory Visit (HOSPITAL_COMMUNITY): Payer: Self-pay | Admitting: Psychiatry

## 2019-07-18 ENCOUNTER — Telehealth (HOSPITAL_COMMUNITY): Payer: Self-pay | Admitting: *Deleted

## 2019-07-18 MED ORDER — ALPRAZOLAM 1 MG PO TABS: 1.0000 mg | ORAL_TABLET | Freq: Three times a day (TID) | ORAL | 0 refills | Status: DC | PRN

## 2019-07-18 NOTE — Telephone Encounter (Signed)
PATIENT CALLED CONCERNED THAT SHE PICKED A SCRIPT FOR [place] ON  08/14/2019.  AND THAT HER NEXT APPT IS 08/20/2019 & THAT SHE WOULD BE OUT OF HER MED'S BEFORE THEN.  SPOKE WITH THE Rx & PATIENT HAS  A REFILL ON FILE/HOLD FOR PROZAC,  0 REFILLS  NOTHING ON FILE/HOLD FOR XANAX & HER 30 DAY REFILL WILL BE OUT ON 08/14/2019. REQUEST REFILL FOR Duanne Moron

## 2019-07-18 NOTE — Telephone Encounter (Signed)
sent 

## 2019-08-20 ENCOUNTER — Ambulatory Visit (INDEPENDENT_AMBULATORY_CARE_PROVIDER_SITE_OTHER): Payer: Medicare Other | Admitting: Psychiatry

## 2019-08-20 ENCOUNTER — Encounter (HOSPITAL_COMMUNITY): Payer: Self-pay | Admitting: Psychiatry

## 2019-08-20 ENCOUNTER — Other Ambulatory Visit: Payer: Self-pay

## 2019-08-20 DIAGNOSIS — F411 Generalized anxiety disorder: Secondary | ICD-10-CM

## 2019-08-20 MED ORDER — ALPRAZOLAM 1 MG PO TABS: 1.0000 mg | ORAL_TABLET | Freq: Three times a day (TID) | ORAL | 5 refills | Status: DC | PRN

## 2019-08-20 MED ORDER — FLUOXETINE HCL 20 MG PO CAPS: 20.0000 mg | ORAL_CAPSULE | Freq: Every day | ORAL | 5 refills | Status: DC

## 2019-08-20 NOTE — Progress Notes (Signed)
Virtual Visit via Telephone Note  I connected with Maria LovelyJean E Petty on 08/20/19 at 11:00 AM EST by telephone and verified that I am speaking with the correct person using two identifiers.   I discussed the limitations, risks, security and privacy concerns of performing an evaluation and management service by telephone and the availability of in person appointments. I also discussed with the patient that there may be a patient responsible charge related to this service. The patient expressed understanding and agreed to proceed.     I discussed the assessment and treatment plan with the patient. The patient was provided an opportunity to ask questions and all were answered. The patient agreed with the plan and demonstrated an understanding of the instructions.   The patient was advised to call back or seek an in-person evaluation if the symptoms worsen or if the condition fails to improve as anticipated.  I provided 15 minutes of non-face-to-face time during this encounter.   Diannia Rudereborah Demontez Novack, MD  [facility] MD/PA/NP OP Progress Note  08/20/2019 11:22 AM Maria LovelyJean E Petty  MRN:  [id number]  Chief Complaint:  Chief Complaint    Depression; Anxiety     HPI: this patient is a 68 year old divorced white female who lives alone in [place]. She has no children. She is currently unemployed but is living off an inheritance. 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  The patient was referred by Dr. Apolonio SchneidersJulia Brannan her psychologist for further treatment of depression and anxiety.  The patient states that she has been depressed and drinking heavily since age 68. She was up to about 12 beers a day. When she was in her 30s she saw a psychologist and psychiatrist in [place] who were very helpful and she started on Prozac. She stopped the Prozac after number of years because it was no longer working. The patient states that both of her parents were very difficult people her mother was narcissistic and manipulative in her father  use to kill her pets in front of her and was very mean and avoidant.  The patient has continued to drink but was admitted to [facility] in 2013 because of alcoholic hepatitis. She had one previous seizure which was probably alcohol related. In 2014 she had continued to drink but admits to days of alcohol and had a grand mal seizure in a grocery store and was [facility] at [facility]. She has remained on Keppra since then. After that she stopped drinking for about a year and stayed sober until August 2015. She started drinking again but stopped again on January 1 of this year.  The patient restarted drinking last fall because she had severe insomnia. Her doctor tried trazodone which causes nightmares and then Ambien which made her feel strange. She finally went back to drinking but wanted to stop and he prescribed Xanax. This is working out very well for her anxiety and insomnia. However she is still depressed. 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 She feels embarrassed and ashamed about relapsing into drinking and having to lose the shelter. She is to have a lot of friends but has given up on other people and spends most of her time alone. She is not close to family as both parents are deceased and her brother lives in [place]. She has 3 dogs. She stays busy but has difficulty  concentrating on reading. She's anxious a good deal of the time her mood is low and she's unmotivated to do anything like fix up her house. She claims that she "hates myself" but denies ever wanting to kill herself. 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 However she claims she no longer has the urge to drink. She is initially very reluctant to go back on antidepressants but I explained to her that she has numerous symptoms of depression that would benefit and then she would  agree  The patient returns for follow-up after 6 months.  She states that in general she is doing well.  She denies any symptoms of serious depression or anxiety.  Sometimes she has difficulty with sleep and wakes up in the middle of the night but if she gets back on melatonin for a while this seems to improve.  She denies any thoughts of self-harm or suicide.  She is staying busy and active working in her yard and talking to neighbors.  She rarely goes out to larger venues but always wears her mask.  Her general health has been good.  She does feel that her medications are helping both depression and anxiety Visit Diagnosis:    ICD-10-CM   1. Generalized anxiety disorder  F41.1     Past Psychiatric History: none  Past Medical History:  Past Medical History:  Diagnosis Date  . Acute alcoholic pancreatitis 05/13/2012  . Alcohol abuse 05/13/2012  . Chronic hepatitis C (HCC)    eradicated with Harvoni 2017/2018  . Depression   . History of seizures 05/13/2012  . Hypoglycemia   . Hyponatremia 05/13/2012  . Macrocytic Anemia 05/13/2012  . Raynaud's syndrome   . Seizures (HCC)     Past Surgical History:  Procedure Laterality Date  . RHINOPLASTY    . TUBAL LIGATION      Family Psychiatric History: see below  Family History:  Family History  Problem Relation Age of Onset  . Colon cancer Mother        20  . Depression Father   . Cervical cancer Maternal Aunt     Social History:  Social History   Socioeconomic History  . Marital status: Divorced    Spouse name: Not on file  . Number of children: Not on file  . Years of education: Not on file  . Highest education level: Not on file  Occupational History  . Not on file  Tobacco Use  . Smoking status: Never Smoker  . Smokeless tobacco: Never Used  Substance and Sexual Activity  . Alcohol use: No    Alcohol/week: 6.0 standard drinks    Types: 6 Cans of beer per week    Comment: history of heavy etoh abuse for 30+ years, but  quit in 2015 per patient  . Drug use: No  . Sexual activity: Yes    Birth control/protection: Surgical  Other Topics Concern  . Not on file  Social History Narrative  . Not on file   Social Determinants of Health   Financial Resource Strain:   . Difficulty of Paying Living Expenses: Not on file  Food Insecurity:   . Worried About Programme researcher, broadcasting/film/video in the Last Year: Not on file  . Ran Out of Food in the Last Year: Not on file  Transportation Needs:   . Lack of Transportation (Medical): Not on file  . Lack of Transportation (Non-Medical): Not on file  Physical Activity:   .  Days of Exercise per Week: Not on file  . Minutes of Exercise per Session: Not on file  Stress:   . Feeling of Stress : Not on file  Social Connections:   . Frequency of Communication with Friends and Family: Not on file  . Frequency of Social Gatherings with Friends and Family: Not on file  . Attends Religious Services: Not on file  . Active Member of Clubs or Organizations: Not on file  . Attends Archivist Meetings: Not on file  . Marital Status: Not on file    Allergies: No Known Allergies  Metabolic Disorder Labs: No results found for: HGBA1C, MPG No results found for: PROLACTIN Lab Results  Component Value Date   TRIG 37 05/13/2012   Lab Results  Component Value Date   TSH 1.162 05/12/2012    Therapeutic Level Labs: No results found for: LITHIUM No results found for: VALPROATE No components found for:  CBMZ  Current Medications: Current Outpatient Medications  Medication Sig Dispense Refill  . ALPRAZolam (XANAX) 1 MG tablet Take 1 tablet (1 mg total) by mouth 3 (three) times daily as needed for anxiety. 90 tablet 5  . FLUoxetine (PROZAC) 20 MG capsule Take 1 capsule (20 mg total) by mouth daily. 30 capsule 5  . levETIRAcetam (KEPPRA) 500 MG tablet Take 500 mg by mouth 2 (two) times daily.    Marland Kitchen lisinopril (PRINIVIL,ZESTRIL) 40 MG tablet Take 40 mg by mouth daily.    .  Multiple Vitamin (MULTIVITAMIN WITH MINERALS) TABS Take 1 tablet by mouth daily.     No current facility-administered medications for this visit.     Musculoskeletal: Strength & Muscle Tone: within normal limits Gait & Station: normal Patient leans: N/A  Psychiatric Specialty Exam: Review of Systems  All other systems reviewed and are negative.   There were no vitals taken for this visit.There is no height or weight on file to calculate BMI.  General Appearance: NA  Eye Contact:  NA  Speech:  Clear and Coherent  Volume:  Normal  Mood:  Euthymic  Affect:  NA  Thought Process:  Goal Directed  Orientation:  Full (Time, Place, and Person)  Thought Content: WDL   Suicidal Thoughts:  No  Homicidal Thoughts:  No  Memory:  Immediate;   Good Recent;   Good Remote;   Fair  Judgement:  Good  Insight:  Good  Psychomotor Activity:  Normal  Concentration:  Concentration: Good and Attention Span: Good  Recall:  Good  Fund of Knowledge: Good  Language: Good  Akathisia:  No  Handed:  Right  AIMS (if indicated): not done  Assets:  Communication Skills Desire for Improvement Physical Health Resilience Social Support Talents/Skills  ADL's:  Intact  Cognition: WNL  Sleep:  Fair   Screenings:   Assessment and Plan: This patient is a 68 year old female with a history of depression and anxiety.  She continues to do well on her current regimen.  She will continue Xanax 1 mg 3 times daily for anxiety and Prozac 20 mg daily for depression.  She will return to see me in 6 months   Levonne Spiller, MD 08/20/2019, 11:22 AM

## 2019-09-10 ENCOUNTER — Telehealth (HOSPITAL_COMMUNITY): Payer: Self-pay | Admitting: *Deleted

## 2019-09-10 ENCOUNTER — Other Ambulatory Visit (HOSPITAL_COMMUNITY): Payer: Self-pay | Admitting: Psychiatry

## 2019-09-10 MED ORDER — FLUOXETINE HCL 20 MG PO CAPS: 20.0000 mg | ORAL_CAPSULE | Freq: Every day | ORAL | 5 refills | Status: DC

## 2019-09-10 MED ORDER — ALPRAZOLAM 1 MG PO TABS: 1.0000 mg | ORAL_TABLET | Freq: Three times a day (TID) | ORAL | 5 refills | Status: DC | PRN

## 2019-09-10 NOTE — Telephone Encounter (Signed)
SPOKE WITH PATIENT WHO HAD REQUESTED HER XANAX SCRIPT BE SENT TO [facility]. PATIENT STATES HSE HAS BEEN INFORMED THAT Rx WILL NOT FILL/ACCEPT  MED'S  FOR ONLY ONE SCRIPT & THAT SHE COULD TRANSFER ALL MED'S. HER INSURANCE DOESN'T COVER [facility] SO SHE WILL HAVE TO CONTINUE USING [facility] FOR HER SCRIPTS

## 2019-09-10 NOTE — Telephone Encounter (Signed)
sent 

## 2019-09-10 NOTE — Telephone Encounter (Signed)
PATIENT NEEDS TO TRANSFER MED BACK TO [facility] rX

## 2019-11-29 DIAGNOSIS — Z23 Encounter for immunization: Secondary | ICD-10-CM | POA: Diagnosis not present

## 2019-12-27 DIAGNOSIS — Z23 Encounter for immunization: Secondary | ICD-10-CM | POA: Diagnosis not present

## 2020-01-23 DIAGNOSIS — G4089 Other seizures: Secondary | ICD-10-CM | POA: Diagnosis not present

## 2020-01-23 DIAGNOSIS — A09 Infectious gastroenteritis and colitis, unspecified: Secondary | ICD-10-CM | POA: Diagnosis not present

## 2020-01-23 DIAGNOSIS — I10 Essential (primary) hypertension: Secondary | ICD-10-CM | POA: Diagnosis not present

## 2020-02-05 ENCOUNTER — Other Ambulatory Visit: Payer: Self-pay

## 2020-02-05 ENCOUNTER — Encounter (HOSPITAL_COMMUNITY): Payer: Self-pay | Admitting: Psychiatry

## 2020-02-05 ENCOUNTER — Telehealth (INDEPENDENT_AMBULATORY_CARE_PROVIDER_SITE_OTHER): Payer: Medicare Other | Admitting: Psychiatry

## 2020-02-05 DIAGNOSIS — F411 Generalized anxiety disorder: Secondary | ICD-10-CM | POA: Diagnosis not present

## 2020-02-05 MED ORDER — ALPRAZOLAM 1 MG PO TABS: 1.0000 mg | ORAL_TABLET | Freq: Three times a day (TID) | ORAL | 5 refills | Status: DC | PRN

## 2020-02-05 MED ORDER — FLUOXETINE HCL 20 MG PO CAPS: 20.0000 mg | ORAL_CAPSULE | Freq: Every day | ORAL | 5 refills | Status: DC

## 2020-02-05 NOTE — Progress Notes (Signed)
Virtual Visit via Telephone Note  I connected with Maria Petty on 02/05/20 at 11:00 AM EDT by telephone and verified that I am speaking with the correct person using two identifiers.   I discussed the limitations, risks, security and privacy concerns of performing an evaluation and management service by telephone and the availability of in person appointments. I also discussed with the patient that there may be a patient responsible charge related to this service. The patient expressed understanding and agreed to proceed.     I discussed the assessment and treatment plan with the patient. The patient was provided an opportunity to ask questions and all were answered. The patient agreed with the plan and demonstrated an understanding of the instructions.   The patient was advised to call back or seek an in-person evaluation if the symptoms worsen or if the condition fails to improve as anticipated.  I provided 15 minutes of non-face-to-face time during this encounter. Location: Provider office, patient home  Diannia Ruder, MD  [facility] MD/PA/NP OP Progress Note  02/05/2020 11:15 AM Maria Petty  MRN:  [id number]  Chief Complaint: Depression and anxiety HPI: this patient is a 69 year old divorced white female who lives alone in [place]. She has no children. She is currently unemployed but is living off an inheritance. She used to run an Furniture conservator/restorer.  The patient was referred by Dr. Apolonio Schneiders her psychologist for further treatment of depression and anxiety.  The patient states that she has been depressed and drinking heavily since age 69. She was up to about 12 beers a day. When she was in her 30s she saw a psychologist and psychiatrist in [place] who were very helpful and she started on Prozac. She stopped the Prozac after number of years because it was no longer working. The patient states that both of her parents were very difficult people her mother was narcissistic and manipulative in  her father use to kill her pets in front of her and was very mean and avoidant.  The patient has continued to drink but was admitted to [facility] in 2013 because of alcoholic hepatitis. She had one previous seizure which was probably alcohol related. In 2014 she had continued to drink but admits to days of alcohol and had a grand mal seizure in a grocery store and was treated at [facility]. She has remained on Keppra since then. After that she stopped drinking for about a year and stayed sober until August 2015. She started drinking again but stopped again on January 1 of this year.  The patient restarted drinking last fall because she had severe insomnia. Her doctor tried trazodone which causes nightmares and then Ambien which made her feel strange. She finally went back to drinking but wanted to stop and he prescribed Xanax. This is working out very well for her anxiety and insomnia. However she is still depressed. 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 She feels embarrassed and ashamed about relapsing into drinking and having to lose the shelter. She is to have a lot of friends but has given up on other people and spends most of her time alone. She is not close to family as both parents are deceased and her brother lives in [place]. She has 3 dogs. She stays busy but has difficulty concentrating on reading. She's anxious  a good deal of the time her mood is low and she's unmotivated to do anything like fix up her house. She claims that she "hates myself" but denies ever wanting to kill herself. 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 However she claims she no longer has the urge to drink. She is initially very reluctant to go back on antidepressants but I explained to her that she has numerous symptoms of depression that would benefit and then she  would agree  The patient returns for follow-up after 6 months.  She states that she is doing well.  She spends a lot of time working on her yard and being outdoors.  She states that her mood has been good and she denies significant anxiety depression or suicidal ideation.  She still has occasional trouble with sleep but stopped taking melatonin due to nightmares.  Usually if she reads for a while she will eventually fall asleep.  She has had no further seizures or new health issues Visit Diagnosis:    ICD-10-CM   1. Generalized anxiety disorder  F41.1     Past Psychiatric History: none  Past Medical History:  Past Medical History:  Diagnosis Date  . Acute alcoholic pancreatitis 05/13/2012  . Alcohol abuse 05/13/2012  . Chronic hepatitis C (HCC)    eradicated with Harvoni 2017/2018  . Depression   . History of seizures 05/13/2012  . Hypoglycemia   . Hyponatremia 05/13/2012  . Macrocytic Anemia 05/13/2012  . Raynaud's syndrome   . Seizures (HCC)     Past Surgical History:  Procedure Laterality Date  . RHINOPLASTY    . TUBAL LIGATION      Family Psychiatric History: see below  Family History:  Family History  Problem Relation Age of Onset  . Colon cancer Mother        82  . Depression Father   . Cervical cancer Maternal Aunt     Social History:  Social History   Socioeconomic History  . Marital status: Divorced    Spouse name: Not on file  . Number of children: Not on file  . Years of education: Not on file  . Highest education level: Not on file  Occupational History  . Not on file  Tobacco Use  . Smoking status: Never Smoker  . Smokeless tobacco: Never Used  Substance and Sexual Activity  . Alcohol use: No    Alcohol/week: 6.0 standard drinks    Types: 6 Cans of beer per week    Comment: history of heavy etoh abuse for 30+ years, but quit in 2015 per patient  . Drug use: No  . Sexual activity: Yes    Birth control/protection: Surgical  Other Topics Concern   . Not on file  Social History Narrative  . Not on file   Social Determinants of Health   Financial Resource Strain:   . Difficulty of Paying Living Expenses:   Food Insecurity:   . Worried About Programme researcher, broadcasting/film/video in the Last Year:   . Barista in the Last Year:   Transportation Needs:   . Freight forwarder (Medical):   Marland Kitchen Lack of Transportation (Non-Medical):   Physical Activity:   . Days of Exercise per Week:   . Minutes of Exercise per Session:   Stress:   . Feeling of Stress :   Social Connections:   . Frequency of Communication with Friends and Family:   . Frequency of Social  Gatherings with Friends and Family:   . Attends Religious Services:   . Active Member of Clubs or Organizations:   . Attends Archivist Meetings:   Marland Kitchen Marital Status:     Allergies: No Known Allergies  Metabolic Disorder Labs: No results found for: HGBA1C, MPG No results found for: PROLACTIN Lab Results  Component Value Date   TRIG 37 05/13/2012   Lab Results  Component Value Date   TSH 1.162 05/12/2012    Therapeutic Level Labs: No results found for: LITHIUM No results found for: VALPROATE No components found for:  CBMZ  Current Medications: Current Outpatient Medications  Medication Sig Dispense Refill  . ALPRAZolam (XANAX) 1 MG tablet Take 1 tablet (1 mg total) by mouth 3 (three) times daily as needed for anxiety. 90 tablet 5  . FLUoxetine (PROZAC) 20 MG capsule Take 1 capsule (20 mg total) by mouth daily. 30 capsule 5  . levETIRAcetam (KEPPRA) 500 MG tablet Take 500 mg by mouth 2 (two) times daily.    Marland Kitchen lisinopril (PRINIVIL,ZESTRIL) 40 MG tablet Take 40 mg by mouth daily.    . Multiple Vitamin (MULTIVITAMIN WITH MINERALS) TABS Take 1 tablet by mouth daily.     No current facility-administered medications for this visit.     Musculoskeletal: Strength & Muscle Tone: within normal limits Gait & Station: normal Patient leans: N/A  Psychiatric Specialty  Exam: Review of Systems  All other systems reviewed and are negative.   There were no vitals taken for this visit.There is no height or weight on file to calculate BMI.  General Appearance: NA  Eye Contact:  NA  Speech:  Clear and Coherent  Volume:  Normal  Mood:  Euthymic  Affect:  NA  Thought Process:  Goal Directed  Orientation:  Full (Time, Place, and Person)  Thought Content: WDL   Suicidal Thoughts:  No  Homicidal Thoughts:  No  Memory:  Immediate;   Good Recent;   Good Remote;   Good  Judgement:  Good  Insight:  Good  Psychomotor Activity:  Normal  Concentration:  Concentration: Good and Attention Span: Good  Recall:  Good  Fund of Knowledge: Good  Language: Good  Akathisia:  No  Handed:  Right  AIMS (if indicated): not done  Assets:  Communication Skills Desire for Improvement Physical Health Resilience Social Support Talents/Skills  ADL's:  Intact  Cognition: WNL  Sleep:  Fair   Screenings:   Assessment and Plan: This patient is a 69 year old female with a history of depression and anxiety.  She continues to do well on her current regimen.  She will continue Xanax 1 mg 3 times daily for anxiety and Prozac 20 mg daily for depression.  She will return to see me in 6 months   Levonne Spiller, MD 02/05/2020, 11:15 AM

## 2020-02-20 ENCOUNTER — Ambulatory Visit (HOSPITAL_COMMUNITY): Payer: Medicare Other | Admitting: Psychiatry

## 2020-04-02 DIAGNOSIS — Z79899 Other long term (current) drug therapy: Secondary | ICD-10-CM | POA: Diagnosis not present

## 2020-04-02 DIAGNOSIS — Z13220 Encounter for screening for lipoid disorders: Secondary | ICD-10-CM | POA: Diagnosis not present

## 2020-04-02 DIAGNOSIS — G4089 Other seizures: Secondary | ICD-10-CM | POA: Diagnosis not present

## 2020-04-02 DIAGNOSIS — F419 Anxiety disorder, unspecified: Secondary | ICD-10-CM | POA: Diagnosis not present

## 2020-04-02 DIAGNOSIS — Z1329 Encounter for screening for other suspected endocrine disorder: Secondary | ICD-10-CM | POA: Diagnosis not present

## 2020-04-02 DIAGNOSIS — G4701 Insomnia due to medical condition: Secondary | ICD-10-CM | POA: Diagnosis not present

## 2020-04-02 DIAGNOSIS — Z131 Encounter for screening for diabetes mellitus: Secondary | ICD-10-CM | POA: Diagnosis not present

## 2020-04-02 DIAGNOSIS — Z0000 Encounter for general adult medical examination without abnormal findings: Secondary | ICD-10-CM | POA: Diagnosis not present

## 2020-04-02 DIAGNOSIS — I10 Essential (primary) hypertension: Secondary | ICD-10-CM | POA: Diagnosis not present

## 2020-05-06 DIAGNOSIS — I10 Essential (primary) hypertension: Secondary | ICD-10-CM | POA: Diagnosis not present

## 2020-05-06 DIAGNOSIS — G4089 Other seizures: Secondary | ICD-10-CM | POA: Diagnosis not present

## 2020-05-06 DIAGNOSIS — Z1329 Encounter for screening for other suspected endocrine disorder: Secondary | ICD-10-CM | POA: Diagnosis not present

## 2020-05-06 DIAGNOSIS — Z13220 Encounter for screening for lipoid disorders: Secondary | ICD-10-CM | POA: Diagnosis not present

## 2020-05-06 DIAGNOSIS — Z131 Encounter for screening for diabetes mellitus: Secondary | ICD-10-CM | POA: Diagnosis not present

## 2020-05-06 DIAGNOSIS — Z79899 Other long term (current) drug therapy: Secondary | ICD-10-CM | POA: Diagnosis not present

## 2020-05-29 DIAGNOSIS — F419 Anxiety disorder, unspecified: Secondary | ICD-10-CM | POA: Diagnosis not present

## 2020-05-29 DIAGNOSIS — G4089 Other seizures: Secondary | ICD-10-CM | POA: Diagnosis not present

## 2020-05-29 DIAGNOSIS — F329 Major depressive disorder, single episode, unspecified: Secondary | ICD-10-CM | POA: Diagnosis not present

## 2020-05-29 DIAGNOSIS — I10 Essential (primary) hypertension: Secondary | ICD-10-CM | POA: Diagnosis not present

## 2020-07-22 DIAGNOSIS — Z23 Encounter for immunization: Secondary | ICD-10-CM | POA: Diagnosis not present

## 2020-07-28 ENCOUNTER — Telehealth (INDEPENDENT_AMBULATORY_CARE_PROVIDER_SITE_OTHER): Payer: Medicare Other | Admitting: Psychiatry

## 2020-07-28 ENCOUNTER — Other Ambulatory Visit: Payer: Self-pay

## 2020-07-28 ENCOUNTER — Encounter (HOSPITAL_COMMUNITY): Payer: Self-pay | Admitting: Psychiatry

## 2020-07-28 DIAGNOSIS — F411 Generalized anxiety disorder: Secondary | ICD-10-CM

## 2020-07-28 MED ORDER — ALPRAZOLAM 1 MG PO TABS: 1.0000 mg | ORAL_TABLET | Freq: Three times a day (TID) | ORAL | 5 refills | Status: DC | PRN

## 2020-07-28 MED ORDER — FLUOXETINE HCL 20 MG PO CAPS: 20.0000 mg | ORAL_CAPSULE | Freq: Every day | ORAL | 5 refills | Status: DC

## 2020-07-28 NOTE — Progress Notes (Signed)
Virtual Visit via Telephone Note  I connected with Maria Petty on 07/28/20 at 11:20 AM EST by telephone and verified that I am speaking with the correct person using two identifiers.  Location: Patient: home Provider: office   I discussed the limitations, risks, security and privacy concerns of performing an evaluation and management service by telephone and the availability of in person appointments. I also discussed with the patient that there may be a patient responsible charge related to this service. The patient expressed understanding and agreed to proceed.    I discussed the assessment and treatment plan with the patient. The patient was provided an opportunity to ask questions and all were answered. The patient agreed with the plan and demonstrated an understanding of the instructions.   The patient was advised to call back or seek an in-person evaluation if the symptoms worsen or if the condition fails to improve as anticipated.  I provided 15 minutes of non-face-to-face time during this encounter.   Maria Ruder, MD  [facility] [place] [facility] MD/PA/NP OP Progress Note  07/28/2020 11:30 AM Maria Petty  MRN:  [id number]  Chief Complaint:  Chief Complaint    Depression; Anxiety; Follow-up     HPI: this patient is a 69 year old divorced white female who lives alone in [place]. She has no children. She is currently unemployed but is living off an inheritance. She used to run an Furniture conservator/restorer.  The patient was referred by Dr. Apolonio Schneiders her psychologist for further treatment of depression and anxiety.  The patient states that she has been depressed and drinking heavily since age 64. She was up to about 12 beers a day. When she was in her 30s she saw a psychologist and psychiatrist in [place] who were very helpful and she started on Prozac. She stopped the Prozac after number of years because it was no longer working. The patient states that both of her parents were very difficult people her  mother was narcissistic and manipulative in her father use to kill her pets in front of her and was very mean and avoidant.  The patient has continued to drink but was admitted to [facility] in 2013 because of alcoholic hepatitis. She had one previous seizure which was probably alcohol related. In 2014 she had continued to drink but admits to days of alcohol and had a grand mal seizure in a grocery store and was treated at [facility]. She has remained on Keppra since then. After that she stopped drinking for about a year and stayed sober until August 2015. She started drinking again but stopped again on January 1 of this year.  The patient restarted drinking last fall because she had severe insomnia. Her doctor tried trazodone which causes nightmares and then Ambien which made her feel strange. She finally went back to drinking but wanted to stop and he prescribed Xanax. This is working out very well for her anxiety and insomnia. However she is still depressed. 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 She feels embarrassed and ashamed about relapsing into drinking and having to lose the shelter. She is to have a lot of friends but has given up on other people and spends most of her time alone. She is not close to family as both parents are deceased and her brother lives in [place]. She has 3 dogs.  She stays busy but has difficulty concentrating on reading. She's anxious a good deal of the time her mood is low and she's unmotivated to do anything like fix up her house. She claims that she "hates myself" but denies ever wanting to kill herself. 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 However she claims she no longer has the urge to drink. She is initially very reluctant to go back on antidepressants but I explained to her that she has numerous symptoms of  depression that would benefit and then she would agree  The patient returns for follow-up after 6 months. She states that she continues to do well. 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 She does keep in contact with a lot of people through email and talking and occasional visits. She is gotten all her vaccines. She denies significant depression still has some mild anxiety but the Xanax helps. She is sleeping well. She does think that her medications have been very helpful Visit Diagnosis:    ICD-10-CM   1. Generalized anxiety disorder  F41.1     Past Psychiatric History: none  Past Medical History:  Past Medical History:  Diagnosis Date  . Acute alcoholic pancreatitis 05/13/2012  . Alcohol abuse 05/13/2012  . Chronic hepatitis C (HCC)    eradicated with Harvoni 2017/2018  . Depression   . History of seizures 05/13/2012  . Hypoglycemia   . Hyponatremia 05/13/2012  . Macrocytic Anemia 05/13/2012  . Raynaud's syndrome   . Seizures (HCC)     Past Surgical History:  Procedure Laterality Date  . RHINOPLASTY    . TUBAL LIGATION      Family Psychiatric History: see below  Family History:  Family History  Problem Relation Age of Onset  . Colon cancer Mother        31  . Depression Father   . Cervical cancer Maternal Aunt     Social History:  Social History   Socioeconomic History  . Marital status: Divorced    Spouse name: Not on file  . Number of children: Not on file  . Years of education: Not on file  . Highest education level: Not on file  Occupational History  . Not on file  Tobacco Use  . Smoking status: Never Smoker  . Smokeless tobacco: Never Used  Substance and Sexual Activity  . Alcohol use: No    Alcohol/week: 6.0 standard drinks    Types: 6 Cans of beer per week    Comment: history of heavy etoh abuse for 30+ years, but quit in 2015 per patient  . Drug use: No  . Sexual activity: Yes    Birth control/protection: Surgical   Other Topics Concern  . Not on file  Social History Narrative  . Not on file   Social Determinants of Health   Financial Resource Strain:   . Difficulty of Paying Living Expenses: Not on file  Food Insecurity:   . Worried About Programme researcher, broadcasting/film/video in the Last Year: Not on file  . Ran Out of Food in the Last Year: Not on file  Transportation Needs:   . Lack of Transportation (Medical): Not on file  . Lack of Transportation (Non-Medical): Not on file  Physical Activity:   . Days of Exercise per Week: Not on file  . Minutes of Exercise per Session: Not on file  Stress:   . Feeling of Stress : Not on  file  Social Connections:   . Frequency of Communication with Friends and Family: Not on file  . Frequency of Social Gatherings with Friends and Family: Not on file  . Attends Religious Services: Not on file  . Active Member of Clubs or Organizations: Not on file  . Attends Banker Meetings: Not on file  . Marital Status: Not on file    Allergies: No Known Allergies  Metabolic Disorder Labs: No results found for: HGBA1C, MPG No results found for: PROLACTIN Lab Results  Component Value Date   TRIG 37 05/13/2012   Lab Results  Component Value Date   TSH 1.162 05/12/2012    Therapeutic Level Labs: No results found for: LITHIUM No results found for: VALPROATE No components found for:  CBMZ  Current Medications: Current Outpatient Medications  Medication Sig Dispense Refill  . ALPRAZolam (XANAX) 1 MG tablet Take 1 tablet (1 mg total) by mouth 3 (three) times daily as needed for anxiety. 90 tablet 5  . FLUoxetine (PROZAC) 20 MG capsule Take 1 capsule (20 mg total) by mouth daily. 30 capsule 5  . levETIRAcetam (KEPPRA) 500 MG tablet Take 500 mg by mouth 2 (two) times daily.    Marland Kitchen lisinopril (PRINIVIL,ZESTRIL) 40 MG tablet Take 40 mg by mouth daily.    . Multiple Vitamin (MULTIVITAMIN WITH MINERALS) TABS Take 1 tablet by mouth daily.     No current  facility-administered medications for this visit.     Musculoskeletal: Strength & Muscle Tone: within normal limits Gait & Station: normal Patient leans: N/A  Psychiatric Specialty Exam: Review of Systems  All other systems reviewed and are negative.   There were no vitals taken for this visit.There is no height or weight on file to calculate BMI.  General Appearance: NA  Eye Contact:  NA  Speech:  Clear and Coherent  Volume:  Normal  Mood:  Euthymic  Affect:  NA  Thought Process:  Goal Directed  Orientation:  Full (Time, Place, and Person)  Thought Content: Rumination   Suicidal Thoughts:  No  Homicidal Thoughts:  No  Memory:  Immediate;   Good Recent;   Good Remote;   Good  Judgement:  Good  Insight:  Good  Psychomotor Activity:  Normal  Concentration:  Concentration: Good and Attention Span: Good  Recall:  Good  Fund of Knowledge: Good  Language: Good  Akathisia:  No  Handed:  Right  AIMS (if indicated): not done  Assets:  Communication Skills Desire for Improvement Physical Health Resilience Social Support Talents/Skills  ADL's:  Intact  Cognition: WNL  Sleep:  Good   Screenings:   Assessment and Plan: This patient is a 69 year old female with a history of depression and anxiety. She continues to do very well on her current regimen. She will continue Prozac 20 mg daily for depression and Xanax 1 mg 3 times daily for anxiety. She will return to see me in 6 months   Maria Ruder, MD 07/28/2020, 11:30 AM

## 2021-01-06 ENCOUNTER — Telehealth (INDEPENDENT_AMBULATORY_CARE_PROVIDER_SITE_OTHER): Payer: Medicare Other | Admitting: Psychiatry

## 2021-01-06 ENCOUNTER — Encounter (HOSPITAL_COMMUNITY): Payer: Self-pay | Admitting: Psychiatry

## 2021-01-06 ENCOUNTER — Other Ambulatory Visit: Payer: Self-pay

## 2021-01-06 DIAGNOSIS — F411 Generalized anxiety disorder: Secondary | ICD-10-CM

## 2021-01-06 MED ORDER — ALPRAZOLAM 1 MG PO TABS: 1.0000 mg | ORAL_TABLET | Freq: Three times a day (TID) | ORAL | 5 refills | Status: DC | PRN

## 2021-01-06 MED ORDER — FLUOXETINE HCL 20 MG PO CAPS: 20.0000 mg | ORAL_CAPSULE | Freq: Every day | ORAL | 5 refills | Status: DC

## 2021-01-06 NOTE — Progress Notes (Signed)
Virtual Visit via Telephone Note  I connected with Maria Petty on 01/06/21 at 11:20 AM EDT by telephone and verified that I am speaking with the correct person using two identifiers.  Location: Patient: home Provider: home office   I discussed the limitations, risks, security and privacy concerns of performing an evaluation and management service by telephone and the availability of in person appointments. I also discussed with the patient that there may be a patient responsible charge related to this service. The patient expressed understanding and agreed to proceed.    I discussed the assessment and treatment plan with the patient. The patient was provided an opportunity to ask questions and all were answered. The patient agreed with the plan and demonstrated an understanding of the instructions.   The patient was advised to call back or seek an in-person evaluation if the symptoms worsen or if the condition fails to improve as anticipated.  I provided 15 minutes of non-face-to-face time during this encounter.   Diannia Ruder, MD  [facility] MD/PA/NP OP Progress Note  01/06/2021 11:31 AM Maria Petty  MRN:  [id number]  Chief Complaint:  Chief Complaint    Depression; Anxiety; Follow-up     HPI: this patient is a 70 year old divorced white female who lives alone in [place]. She has no children. She is currently unemployed but is living off an inheritance. She used to run an Furniture conservator/restorer.  The patient returns after 6 months.  She is doing about the same.  She states that she is staying active and busy working in her yard.  She gets very disheartened about things happening in world events but tries not to get too depressed about it.  In general her mood is good and she denies significant depression anxiety thoughts of suicide or self-harm.  She feels that her medications are working well.  She has had no further seizures. Visit Diagnosis:    ICD-10-CM   1. Generalized anxiety disorder   F41.1     Past Psychiatric History: none  Past Medical History:  Past Medical History:  Diagnosis Date  . Acute alcoholic pancreatitis 05/13/2012  . Alcohol abuse 05/13/2012  . Chronic hepatitis C (HCC)    eradicated with Harvoni 2017/2018  . Depression   . History of seizures 05/13/2012  . Hypoglycemia   . Hyponatremia 05/13/2012  . Macrocytic Anemia 05/13/2012  . Raynaud's syndrome   . Seizures (HCC)     Past Surgical History:  Procedure Laterality Date  . RHINOPLASTY    . TUBAL LIGATION      Family Psychiatric History: see below  Family History:  Family History  Problem Relation Age of Onset  . Colon cancer Mother        64  . Depression Father   . Cervical cancer Maternal Aunt     Social History:  Social History   Socioeconomic History  . Marital status: Divorced    Spouse name: Not on file  . Number of children: Not on file  . Years of education: Not on file  . Highest education level: Not on file  Occupational History  . Not on file  Tobacco Use  . Smoking status: Never Smoker  . Smokeless tobacco: Never Used  Substance and Sexual Activity  . Alcohol use: No    Alcohol/week: 6.0 standard drinks    Types: 6 Cans of beer per week    Comment: history of heavy etoh abuse for 30+ years, but quit in 2015 per patient  .  Drug use: No  . Sexual activity: Yes    Birth control/protection: Surgical  Other Topics Concern  . Not on file  Social History Narrative  . Not on file   Social Determinants of Health   Financial Resource Strain: Not on file  Food Insecurity: Not on file  Transportation Needs: Not on file  Physical Activity: Not on file  Stress: Not on file  Social Connections: Not on file    Allergies: No Known Allergies  Metabolic Disorder Labs: No results found for: HGBA1C, MPG No results found for: PROLACTIN Lab Results  Component Value Date   TRIG 37 05/13/2012   Lab Results  Component Value Date   TSH 1.162 05/12/2012     Therapeutic Level Labs: No results found for: LITHIUM No results found for: VALPROATE No components found for:  CBMZ  Current Medications: Current Outpatient Medications  Medication Sig Dispense Refill  . ALPRAZolam (XANAX) 1 MG tablet Take 1 tablet (1 mg total) by mouth 3 (three) times daily as needed for anxiety. 90 tablet 5  . FLUoxetine (PROZAC) 20 MG capsule Take 1 capsule (20 mg total) by mouth daily. 30 capsule 5  . levETIRAcetam (KEPPRA) 500 MG tablet Take 500 mg by mouth 2 (two) times daily.    Marland Kitchen lisinopril (PRINIVIL,ZESTRIL) 40 MG tablet Take 40 mg by mouth daily.    . Multiple Vitamin (MULTIVITAMIN WITH MINERALS) TABS Take 1 tablet by mouth daily.     No current facility-administered medications for this visit.     Musculoskeletal: Strength & Muscle Tone: within normal limits Gait & Station: normal Patient leans: N/A  Psychiatric Specialty Exam: Review of Systems  All other systems reviewed and are negative.   There were no vitals taken for this visit.There is no height or weight on file to calculate BMI.  General Appearance: NA  Eye Contact:  NA  Speech:  Clear and Coherent  Volume:  Normal  Mood:  Euthymic  Affect:  NA  Thought Process:  Goal Directed  Orientation:  Full (Time, Place, and Person)  Thought Content: WDL   Suicidal Thoughts:  No  Homicidal Thoughts:  No  Memory:  Immediate;   Good Recent;   Good Remote;   Fair  Judgement:  Good  Insight:  Good  Psychomotor Activity:  Normal  Concentration:  Concentration: Good and Attention Span: Good  Recall:  Good  Fund of Knowledge: Good  Language: Good  Akathisia:  No  Handed:  Right  AIMS (if indicated): not done  Assets:  Communication Skills Desire for Improvement Physical Health Resilience Social Support Talents/Skills  ADL's:  Intact  Cognition: WNL  Sleep:  Good   Screenings: PHQ2-9   Flowsheet Row [facility]  PHQ-2 Total Score 0    Flowsheet Row Video Visit from 01/06/2021 in [facility]  C-SSRS RISK CATEGORY No Risk       Assessment and Plan: This patient is a 70 year old female with a history of depression and anxiety.  She continues to do very well on her current regimen.  She will continue Prozac 20 mg daily for depression and Xanax 1 mg 3 times daily for anxiety.  She will return to see me in 6 months   Diannia Ruder, MD 01/06/2021, 11:31 AM

## 2021-01-07 ENCOUNTER — Telehealth (HOSPITAL_COMMUNITY): Payer: Medicare Other | Admitting: Psychiatry

## 2021-06-18 ENCOUNTER — Telehealth (INDEPENDENT_AMBULATORY_CARE_PROVIDER_SITE_OTHER): Payer: Medicare Other | Admitting: Psychiatry

## 2021-06-18 ENCOUNTER — Encounter (HOSPITAL_COMMUNITY): Payer: Self-pay | Admitting: Psychiatry

## 2021-06-18 ENCOUNTER — Other Ambulatory Visit: Payer: Self-pay

## 2021-06-18 DIAGNOSIS — F411 Generalized anxiety disorder: Secondary | ICD-10-CM | POA: Diagnosis not present

## 2021-06-18 MED ORDER — FLUOXETINE HCL 20 MG PO CAPS: 20.0000 mg | ORAL_CAPSULE | Freq: Every day | ORAL | 5 refills | Status: DC

## 2021-06-18 MED ORDER — ALPRAZOLAM 1 MG PO TABS: 1.0000 mg | ORAL_TABLET | Freq: Three times a day (TID) | ORAL | 5 refills | Status: DC | PRN

## 2021-06-18 NOTE — Progress Notes (Signed)
Virtual Visit via Telephone Note  I connected with [name] on 06/18/21 at 10:00 AM EDT by telephone and verified that I am speaking with the correct person using two identifiers.  Location: Patient: home Provider: home office   I discussed the limitations, risks, security and privacy concerns of performing an evaluation and management service by telephone and the availability of in person appointments. I also discussed with the patient that there may be a patient responsible charge related to this service. The patient expressed understanding and agreed to proceed.      I discussed the assessment and treatment plan with the patient. The patient was provided an opportunity to ask questions and all were answered. The patient agreed with the plan and demonstrated an understanding of the instructions.   The patient was advised to call back or seek an in-person evaluation if the symptoms worsen or if the condition fails to improve as anticipated.  I provided 10 minutes of non-face-to-face time during this encounter.   Diannia Ruder, MD  [facility] MD/PA/NP OP Progress Note  06/18/2021 10:14 AM ERMEL Petty  MRN:  [id number]  Chief Complaint:  Chief Complaint   Anxiety; Depression; Follow-up    HPI:  This patient is a 70 year old divorced white female who lives alone in [place].  She has no children.  She is currently unemployed but is living often inheritance.  She used to run an Furniture conservator/restorer.  The patient returns for follow-up regarding depression and anxiety.  She was last seen 6 months ago.  She states that she continues to stick with her same routine.  She spends a lot of time doing yard work and gardening.  In general her mood has been good and she is sleeping fairly well.  Xanax continues to help her anxiety for the most part.  She has had no further seizures and is no longer drinking.  She denies significant depression anxiety thoughts of suicide or self-harm. Visit Diagnosis:     ICD-10-CM   1. Generalized anxiety disorder  F41.1       Past Psychiatric History: none  Past Medical History:  Past Medical History:  Diagnosis Date   Acute alcoholic pancreatitis 05/13/2012   Alcohol abuse 05/13/2012   Chronic hepatitis C (HCC)    eradicated with Harvoni 2017/2018   Depression    History of seizures 05/13/2012   Hypoglycemia    Hyponatremia 05/13/2012   Macrocytic Anemia 05/13/2012   Raynaud's syndrome    Seizures (HCC)     Past Surgical History:  Procedure Laterality Date   RHINOPLASTY     TUBAL LIGATION      Family Psychiatric History: see below  Family History:  Family History  Problem Relation Age of Onset   Colon cancer Mother        20   Depression Father    Cervical cancer Maternal Aunt     Social History:  Social History   Socioeconomic History   Marital status: Divorced    Spouse name: Not on file   Number of children: Not on file   Years of education: Not on file   Highest education level: Not on file  Occupational History   Not on file  Tobacco Use   Smoking status: Never   Smokeless tobacco: Never  Substance and Sexual Activity   Alcohol use: No    Alcohol/week: 6.0 standard drinks    Types: 6 Cans of beer per week    Comment: history of heavy etoh  abuse for 30+ years, but quit in 2015 per patient   Drug use: No   Sexual activity: Yes    Birth control/protection: Surgical  Other Topics Concern   Not on file  Social History Narrative   Not on file   Social Determinants of Health   Financial Resource Strain: Not on file  Food Insecurity: Not on file  Transportation Needs: Not on file  Physical Activity: Not on file  Stress: Not on file  Social Connections: Not on file    Allergies: No Known Allergies  Metabolic Disorder Labs: No results found for: HGBA1C, MPG No results found for: PROLACTIN Lab Results  Component Value Date   TRIG 37 05/13/2012   Lab Results  Component Value Date   TSH 1.162 05/12/2012     Therapeutic Level Labs: No results found for: LITHIUM No results found for: VALPROATE No components found for:  CBMZ  Current Medications: Current Outpatient Medications  Medication Sig Dispense Refill   ALPRAZolam (XANAX) 1 MG tablet Take 1 tablet (1 mg total) by mouth 3 (three) times daily as needed for anxiety. 90 tablet 5   FLUoxetine (PROZAC) 20 MG capsule Take 1 capsule (20 mg total) by mouth daily. 30 capsule 5   levETIRAcetam (KEPPRA) 500 MG tablet Take 500 mg by mouth 2 (two) times daily.     lisinopril (PRINIVIL,ZESTRIL) 40 MG tablet Take 40 mg by mouth daily.     Multiple Vitamin (MULTIVITAMIN WITH MINERALS) TABS Take 1 tablet by mouth daily.     No current facility-administered medications for this visit.     Musculoskeletal: Strength & Muscle Tone: na Gait & Station: na Patient leans: N/A  Psychiatric Specialty Exam: Review of Systems  All other systems reviewed and are negative.  There were no vitals taken for this visit.There is no height or weight on file to calculate BMI.  General Appearance: NA  Eye Contact:  NA  Speech:  Clear and Coherent  Volume:  Normal  Mood:  Euthymic  Affect:  na  Thought Process:  Goal Directed  Orientation:  Full (Time, Place, and Person)  Thought Content: WDL   Suicidal Thoughts:  No  Homicidal Thoughts:  No  Memory:  Immediate;   Good Recent;   Good Remote;   Fair  Judgement:  Good  Insight:  Good  Psychomotor Activity:  Normal  Concentration:  Concentration: Good and Attention Span: Good  Recall:  Good  Fund of Knowledge: Good  Language: Good  Akathisia:  No  Handed:  Right  AIMS (if indicated): not done  Assets:  Communication Skills Desire for Improvement Physical Health Resilience Social Support Talents/Skills  ADL's:  Intact  Cognition: WNL  Sleep:  Good   Screenings: PHQ2-9    Flowsheet Row Video Visit from [facility]18[facility]2021 [facility] Video Visit from  01/06/2021 in [facility]  PHQ-2 Total Score 0 0      Flowsheet Row Video Visit from 01/06/2021 in [facility]  C-SSRS RISK CATEGORY No Risk        Assessment and Plan:  This patient is a 70 year old female with a history of depression and anxiety.  She continues to do very well on her current regimen.  She will continue Prozac 20 mg daily for depression and Xanax 1 mg 3 times daily for anxiety.  She will return to see me in 6 months  Diannia Ruder, MD 06/18/2021, 10:14 AM

## 2021-10-13 ENCOUNTER — Telehealth (HOSPITAL_COMMUNITY): Payer: Self-pay | Admitting: Psychiatry

## 2021-11-10 ENCOUNTER — Telehealth (HOSPITAL_COMMUNITY): Payer: Medicare Other | Admitting: Psychiatry

## 2021-12-03 ENCOUNTER — Encounter (HOSPITAL_COMMUNITY): Payer: Self-pay | Admitting: Psychiatry

## 2021-12-03 ENCOUNTER — Telehealth (HOSPITAL_COMMUNITY): Payer: Medicare Other | Admitting: Psychiatry

## 2021-12-03 ENCOUNTER — Telehealth (INDEPENDENT_AMBULATORY_CARE_PROVIDER_SITE_OTHER): Payer: Medicare Other | Admitting: Psychiatry

## 2021-12-03 DIAGNOSIS — Z8659 Personal history of other mental and behavioral disorders: Secondary | ICD-10-CM

## 2021-12-03 DIAGNOSIS — F411 Generalized anxiety disorder: Secondary | ICD-10-CM | POA: Diagnosis not present

## 2021-12-03 MED ORDER — FLUOXETINE HCL 20 MG PO CAPS: 20.0000 mg | ORAL_CAPSULE | Freq: Every day | ORAL | 5 refills | Status: DC

## 2021-12-03 MED ORDER — DIAZEPAM 10 MG PO TABS: 10.0000 mg | ORAL_TABLET | Freq: Three times a day (TID) | ORAL | 5 refills | Status: DC

## 2021-12-03 NOTE — Progress Notes (Signed)
Virtual Visit via Telephone Note ? ?I connected with Maria Petty on 12/03/21 at 10:00 AM EDT by telephone and verified that I am speaking with the correct person using two identifiers. ? ?Location: ?Patient: home ?Provider: office ?  ?I discussed the limitations, risks, security and privacy concerns of performing an evaluation and management service by telephone and the availability of in person appointments. I also discussed with the patient that there may be a patient responsible charge related to this service. The patient expressed understanding and agreed to proceed. ? ? ? ?  ?I discussed the assessment and treatment plan with the patient. The patient was provided an opportunity to ask questions and all were answered. The patient agreed with the plan and demonstrated an understanding of the instructions. ?  ?The patient was advised to call back or seek an in-person evaluation if the symptoms worsen or if the condition fails to improve as anticipated. ? ?I provided 15 minutes of non-face-to-face time during this encounter. ? ? ?Maria Ruder, MD ? ?[facility] MD/PA/NP OP Progress Note ? ?12/03/2021 10:13 AM ?Maria Petty  ?MRN:  [id number] ? ?Chief Complaint:  ?Chief Complaint  ?Patient presents with  ? Depression  ? Anxiety  ? Follow-up  ? ?HPI: This patient is a 71 year old divorced white female who lives alone in [place].  She has no children.  She is currently unemployed but is living off an inheritance.  She used to run an Furniture conservator/restorer. ? ?The patient returns for follow-up after 6 months.  In general she is doing fairly well.  She denies significant depression thoughts of self-harm or suicidal ideation.  She is sleeping and eating well.  She is spending a lot of time in her garden.  However she does feel like the Xanax is not working as well as it had in the past.  She has been getting more anxious recently.  She thinks she may be developing a tolerance.  We had tried Ativan before which did not work.  She states in  the distant past she had tried Valium and this seemed to work better and would like to give it a try again.  I think this is reasonable. ?Visit Diagnosis:  ?  ICD-10-CM   ?1. Generalized anxiety disorder  F41.1   ?  ?2. H/O: depression  Z86.59   ?  ? ? ?Past Psychiatric History: none ? ?Past Medical History:  ?Past Medical History:  ?Diagnosis Date  ? Acute alcoholic pancreatitis 05/13/2012  ? Alcohol abuse 05/13/2012  ? Chronic hepatitis C (HCC)   ? eradicated with Harvoni 2017/2018  ? Depression   ? History of seizures 05/13/2012  ? Hypoglycemia   ? Hyponatremia 05/13/2012  ? Macrocytic Anemia 05/13/2012  ? Raynaud's syndrome   ? Seizures (HCC)   ?  ?Past Surgical History:  ?Procedure Laterality Date  ? RHINOPLASTY    ? TUBAL LIGATION    ? ? ?Family Psychiatric History: see below ? ?Family History:  ?Family History  ?Problem Relation Age of Onset  ? Colon cancer Mother   ?     9  ? Depression Father   ? Cervical cancer Maternal Aunt   ? ? ?Social History:  ?Social History  ? ?Socioeconomic History  ? Marital status: Divorced  ?  Spouse name: Not on file  ? Number of children: Not on file  ? Years of education: Not on file  ? Highest education level: Not on file  ?Occupational History  ? Not on  file  ?Tobacco Use  ? Smoking status: Never  ? Smokeless tobacco: Never  ?Substance and Sexual Activity  ? Alcohol use: No  ?  Alcohol/week: 6.0 standard drinks  ?  Types: 6 Cans of beer per week  ?  Comment: history of heavy etoh abuse for 30+ years, but quit in 2015 per patient  ? Drug use: No  ? Sexual activity: Yes  ?  Birth control/protection: Surgical  ?Other Topics Concern  ? Not on file  ?Social History Narrative  ? Not on file  ? ?Social Determinants of Health  ? ?Financial Resource Strain: Not on file  ?Food Insecurity: Not on file  ?Transportation Needs: Not on file  ?Physical Activity: Not on file  ?Stress: Not on file  ?Social Connections: Not on file  ? ? ?Allergies: No Known Allergies ? ?Metabolic Disorder  Labs: ?No results found for: HGBA1C, MPG ?No results found for: PROLACTIN ?Lab Results  ?Component Value Date  ? TRIG 37 05/13/2012  ? ?Lab Results  ?Component Value Date  ? TSH 1.162 05/12/2012  ? ? ?Therapeutic Level Labs: ?No results found for: LITHIUM ?No results found for: VALPROATE ?No components found for:  CBMZ ? ?Current Medications: ?Current Outpatient Medications  ?Medication Sig Dispense Refill  ? diazepam (VALIUM) 10 MG tablet Take 1 tablet (10 mg total) by mouth 3 (three) times daily. 90 tablet 5  ? FLUoxetine (PROZAC) 20 MG capsule Take 1 capsule (20 mg total) by mouth daily. 30 capsule 5  ? levETIRAcetam (KEPPRA) 500 MG tablet Take 500 mg by mouth 2 (two) times daily.    ? lisinopril (PRINIVIL,ZESTRIL) 40 MG tablet Take 40 mg by mouth daily.    ? Multiple Vitamin (MULTIVITAMIN WITH MINERALS) TABS Take 1 tablet by mouth daily.    ? ?No current facility-administered medications for this visit.  ? ? ? ?Musculoskeletal: ?Strength & Muscle Tone: within normal limits ?Gait & Station: normal ?Patient leans: N/A ? ?Psychiatric Specialty Exam: ?Review of Systems  ?Psychiatric/Behavioral:  The patient is nervous/anxious.   ?All other systems reviewed and are negative.  ?There were no vitals taken for this visit.There is no height or weight on file to calculate BMI.  ?General Appearance: NA  ?Eye Contact:  NA  ?Speech:  Clear and Coherent  ?Volume:  Normal  ?Mood:  Anxious  ?Affect:  NA  ?Thought Process:  Goal Directed  ?Orientation:  Full (Time, Place, and Person)  ?Thought Content: Rumination   ?Suicidal Thoughts:  No  ?Homicidal Thoughts:  No  ?Memory:  Immediate;   Good ?Recent;   Good ?Remote;   Good  ?Judgement:  Good  ?Insight:  Good  ?Psychomotor Activity:  Normal  ?Concentration:  Concentration: Good and Attention Span: Good  ?Recall:  Good  ?Fund of Knowledge: Good  ?Language: Good  ?Akathisia:  No  ?Handed:  Right  ?AIMS (if indicated): not done  ?Assets:  Communication Skills ?Desire for  Improvement ?Physical Health ?Resilience ?Social Support ?Talents/Skills  ?ADL's:  Intact  ?Cognition: WNL  ?Sleep:  Good  ? ?Screenings: ?PHQ2-9   ? ?Flowsheet Row Video Visit from 12/03/2021 [facility] Video Visit from 06/18/2021 in [facility] Video Visit from 01/06/2021 in [facility]  ?PHQ-2 Total Score 0 0 0  ? ?  ? ?Flowsheet Row Video Visit from 12/03/2021 in [facility] Video Visit from 01/06/2021 in [facility]  ?C-SSRS RISK CATEGORY No Risk  No Risk  ? ?  ? ? ? ?Assessment and Plan: This patient is a 71 year old female with a history of depression and anxiety.  She states that her depression is well controlled but she has been getting more anxious.  We will continue Prozac 20 mg daily for depression.  She will discontinue Xanax in favor of Valium 10 mg 3 times daily for anxiety.  At her request she will return to see me in 6 months but will call sooner if the Valium is not working for her. ? ?Collaboration of Care: Collaboration of Care: Primary Care Provider AEB chart notes will be released to PCP at patient's request ? ?Patient/Guardian was advised Release of Information must be obtained prior to any record release in order to collaborate their care with an outside provider. Patient/Guardian was advised if they have not already done so to contact the registration department to sign all necessary forms in order for us to release information regarding their care.  ? ?Consent: Patient/Guardian gives verbal consent for treatment and assignment of benefits for services provided during this visit. Patient/Guardian expressed understanding and agreed to proceed.  ? ? ?Maria Rudereborah Kally Cadden, MD ?12/03/2021, 10:13 AM ? ?

## 2022-04-27 ENCOUNTER — Encounter (HOSPITAL_COMMUNITY): Payer: Self-pay | Admitting: Psychiatry

## 2022-04-27 ENCOUNTER — Telehealth (INDEPENDENT_AMBULATORY_CARE_PROVIDER_SITE_OTHER): Payer: Medicare Other | Admitting: Psychiatry

## 2022-04-27 DIAGNOSIS — F411 Generalized anxiety disorder: Secondary | ICD-10-CM | POA: Diagnosis not present

## 2022-04-27 DIAGNOSIS — Z8659 Personal history of other mental and behavioral disorders: Secondary | ICD-10-CM

## 2022-04-27 MED ORDER — DIAZEPAM 10 MG PO TABS: 10.0000 mg | ORAL_TABLET | Freq: Three times a day (TID) | ORAL | 5 refills | Status: DC

## 2022-04-27 MED ORDER — FLUOXETINE HCL 20 MG PO CAPS: 20.0000 mg | ORAL_CAPSULE | Freq: Every day | ORAL | 5 refills | Status: DC

## 2022-04-27 NOTE — Progress Notes (Signed)
Virtual Visit via Telephone Note  I connected with Maria Petty on 04/27/22 at 10:20 AM EDT by telephone and verified that I am speaking with the correct person using two identifiers.  Location: Patient: home Provider: office   I discussed the limitations, risks, security and privacy concerns of performing an evaluation and management service by telephone and the availability of in person appointments. I also discussed with the patient that there may be a patient responsible charge related to this service. The patient expressed understanding and agreed to proceed.      I discussed the assessment and treatment plan with the patient. The patient was provided an opportunity to ask questions and all were answered. The patient agreed with the plan and demonstrated an understanding of the instructions.   The patient was advised to call back or seek an in-person evaluation if the symptoms worsen or if the condition fails to improve as anticipated.  I provided 15 minutes of non-face-to-face time during this encounter.   Diannia Ruder, MD  [facility] MD/PA/NP OP Progress Note  04/27/2022 10:30 AM Maria Petty  MRN:  [id number]  Chief Complaint:  Chief Complaint  Patient presents with   Depression   Anxiety   Follow-up   HPI: This patient is a 71year-old divorced white female who lives alone in [place].  She has no children.  She is currently unemployed but is living off an inheritance.  She used to run an Furniture conservator/restorer.  The patient returns after 6 months regarding her depression and anxiety.  Last time she did not think the Xanax was working for her anymore and we switched to Valium 10 mg 3 times daily.  This seems to be working much better.  She is not as anxious as she has been in the past.  She denies significant depression thoughts of self-harm or suicidal ideation.  She tries to stay busy doing work around her property.  She is about to go to [place] to help a friend watch her  animals. Visit Diagnosis:    ICD-10-CM   1. Generalized anxiety disorder  F41.1     2. 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   Depression    History of seizures 05/13/2012   Hypoglycemia    Hyponatremia 05/13/2012   Macrocytic Anemia 05/13/2012   Raynaud's syndrome    Seizures (HCC)     Past Surgical History:  Procedure Laterality Date   RHINOPLASTY     TUBAL LIGATION      Family Psychiatric History: See below  Family History:  Family History  Problem Relation Age of Onset   Colon cancer Mother        75   Depression Father    Cervical cancer Maternal Aunt     Social History:  Social History   Socioeconomic History   Marital status: Divorced    Spouse name: Not on file   Number of children: Not on file   Years of education: Not on file   Highest education level: Not on file  Occupational History   Not on file  Tobacco Use   Smoking status: Never   Smokeless tobacco: Never  Substance and Sexual Activity   Alcohol use: No    Alcohol/week: 6.0 standard  drinks of alcohol    Types: 6 Cans of beer per week    Comment: history of heavy etoh abuse for 30+ years, but quit in 2015 per patient   Drug use: No   Sexual activity: Yes    Birth control/protection: Surgical  Other Topics Concern   Not on file  Social History Narrative   Not on file   Social Determinants of Health   Financial Resource Strain: Not on file  Food Insecurity: Not on file  Transportation Needs: Not on file  Physical Activity: Not on file  Stress: Not on file  Social Connections: Not on file    Allergies: No Known Allergies  Metabolic Disorder Labs: No results found for: "HGBA1C", "MPG" No results found for: "PROLACTIN" Lab Results  Component Value Date   TRIG  37 05/13/2012   Lab Results  Component Value Date   TSH 1.162 05/12/2012    Therapeutic Level Labs: No results found for: "LITHIUM" No results found for: "VALPROATE" No results found for: "CBMZ"  Current Medications: Current Outpatient Medications  Medication Sig Dispense Refill   diazepam (VALIUM) 10 MG tablet Take 1 tablet (10 mg total) by mouth 3 (three) times daily. 90 tablet 5   FLUoxetine (PROZAC) 20 MG capsule Take 1 capsule (20 mg total) by mouth daily. 30 capsule 5   levETIRAcetam (KEPPRA) 500 MG tablet Take 500 mg by mouth 2 (two) times daily.     lisinopril (PRINIVIL,ZESTRIL) 40 MG tablet Take 40 mg by mouth daily.     Multiple Vitamin (MULTIVITAMIN WITH MINERALS) TABS Take 1 tablet by mouth daily.     No current facility-administered medications for this visit.     Musculoskeletal: Strength & Muscle Tone: na Gait & Station: na Patient leans: N/A  Psychiatric Specialty Exam: Review of Systems  All other systems reviewed and are negative.   There were no vitals taken for this visit.There is no height or weight on file to calculate BMI.  General Appearance: NA  Eye Contact:  NA  Speech:  Clear and Coherent  Volume:  Normal  Mood:  Euthymic  Affect:  NA  Thought Process:  Goal Directed  Orientation:  Full (Time, Place, and Person)  Thought Content: WDL   Suicidal Thoughts:  No  Homicidal Thoughts:  No  Memory:  Immediate;   Good Recent;   Good Remote;   Good  Judgement:  Good  Insight:  Good  Psychomotor Activity:  Normal  Concentration:  Concentration: Good and Attention Span: Good  Recall:  Good  Fund of Knowledge: Good  Language: Good  Akathisia:  No  Handed:  Right  AIMS (if indicated): not done  Assets:  Communication Skills Desire for Improvement Physical Health Resilience Social Support Talents/Skills  ADL's:  Intact  Cognition: WNL  Sleep:  Good   Screenings: PHQ2-9    Flowsheet Row [facility] Video Visit from 12/03/2021 [facility] Video Visit from 06/18/2021 in [facility] Video Visit from 01/06/2021 [facility]  PHQ-2 Total Score 0 0 0 0      Flowsheet Row Video Visit from 04/27/2022 [facility] Video Visit from 12/03/2021 [facility] Video Visit from 01/06/2021 [facility]  C-SSRS RISK CATEGORY No Risk No Risk No Risk        Assessment and Plan: This patient is  a 71 year old female with a history of depression and anxiety.  She is doing well on her current regimen.  She will continue Prozac 20 mg daily for depression and Valium 10 mg 3 times daily for anxiety.  She will return to see me in 6 months  Collaboration of Care: Collaboration of Care: Primary Care Provider AEB chart notes will be shared with PCP at patient's request  Patient/Guardian was advised Release of Information must be obtained prior to any record release in order to collaborate their care with an outside provider. Patient/Guardian was advised if they have not already done so to contact the registration department to sign all necessary forms in order for [place] to release information regarding their care.   Consent: Patient/Guardian gives verbal consent for treatment and assignment of benefits for services provided during this visit. Patient/Guardian expressed understanding and agreed to proceed.    Diannia Ruder, MD 04/27/2022, 10:30 AM

## 2022-10-12 ENCOUNTER — Telehealth (INDEPENDENT_AMBULATORY_CARE_PROVIDER_SITE_OTHER): Payer: Medicare Other | Admitting: Psychiatry

## 2022-10-12 ENCOUNTER — Encounter (HOSPITAL_COMMUNITY): Payer: Self-pay | Admitting: Psychiatry

## 2022-10-12 DIAGNOSIS — Z8659 Personal history of other mental and behavioral disorders: Secondary | ICD-10-CM | POA: Diagnosis not present

## 2022-10-12 DIAGNOSIS — F411 Generalized anxiety disorder: Secondary | ICD-10-CM | POA: Diagnosis not present

## 2022-10-12 MED ORDER — DIAZEPAM 10 MG PO TABS: 10.0000 mg | ORAL_TABLET | Freq: Four times a day (QID) | ORAL | 2 refills | Status: DC | PRN

## 2022-10-12 MED ORDER — FLUOXETINE HCL 20 MG PO CAPS: 20.0000 mg | ORAL_CAPSULE | Freq: Every day | ORAL | 5 refills | Status: DC

## 2022-10-12 NOTE — Progress Notes (Signed)
Virtual Visit via Telephone Note  I connected with Maria Petty on 10/12/22 at 11:00 AM EST by telephone and verified that I am speaking with the correct person using two identifiers.  Location: Patient: home Provider: office   I discussed the limitations, risks, security and privacy concerns of performing an evaluation and management service by telephone and the availability of in person appointments. I also discussed with the patient that there may be a patient responsible charge related to this service. The patient expressed understanding and agreed to proceed.      I discussed the assessment and treatment plan with the patient. The patient was provided an opportunity to ask questions and all were answered. The patient agreed with the plan and demonstrated an understanding of the instructions.   The patient was advised to call back or seek an in-person evaluation if the symptoms worsen or if the condition fails to improve as anticipated.  I provided 15 minutes of non-face-to-face time during this encounter.   Levonne Spiller, MD  [facility] MD/PA/NP OP Progress Note  10/12/2022 11:11 AM Maria Petty  MRN:  [id number]  Chief Complaint:  Chief Complaint  Patient presents with   Depression   Anxiety   Follow-up   HPI: This patient is a 72 year old divorced white female who lives alone in [place].  She has no children.  She is currently unemployed.  She used to run an Programmer, systems.  The patient returns after 6 months regarding her depression and anxiety.  She states that her mood has generally been good and she denies significant depression.  She is sleeping well.  She still has some days where she feels more anxious and would like to have 1 extra Valium per day as needed I think this is reasonable.  She is not using any substances.  She is staying busy doing work around her property.  She denies any thoughts of suicide or self-harm Visit Diagnosis:    ICD-10-CM   1. Generalized anxiety  disorder  F41.1     2. H/O: depression  Z86.59       Past Psychiatric History: none  Past Medical History:  Past Medical History:  Diagnosis Date   Acute alcoholic pancreatitis [id number]   Alcohol abuse 05/13/2012   Chronic hepatitis C ([place])    eradicated with Harvoni 2017/2018   Depression    History of seizures 05/13/2012   Hypoglycemia    Hyponatremia 05/13/2012   Macrocytic Anemia 05/13/2012   Raynaud's syndrome    Seizures ([place])     Past Surgical History:  Procedure Laterality Date   RHINOPLASTY     TUBAL LIGATION      Family Psychiatric History: See below  Family History:  Family History  Problem Relation Age of Onset   Colon cancer Mother        19   Depression Father    Cervical cancer Maternal Aunt     Social History:  Social History   Socioeconomic History   Marital status: Divorced    Spouse name: Not on file   Number of children: Not on file   Years of education: Not on file   Highest education level: Not on file  Occupational History   Not on file  Tobacco Use   Smoking status: Never   Smokeless tobacco: Never  Substance and Sexual Activity   Alcohol use: No    Alcohol/week: 6.0 standard drinks of alcohol    Types: 6 Cans of beer per week  Comment: history of heavy etoh abuse for 30+ years, but quit in 2015 per patient   Drug use: No   Sexual activity: Yes    Birth control/protection: Surgical  Other Topics Concern   Not on file  Social History Narrative   Not on file   Social Determinants of Health   Financial Resource Strain: Not on file  Food Insecurity: Not on file  Transportation Needs: Not on file  Physical Activity: Not on file  Stress: Not on file  Social Connections: Not on file    Allergies: No Known Allergies  Metabolic Disorder Labs: No results found for: "HGBA1C", "MPG" No results found for: "PROLACTIN" Lab Results  Component Value Date   TRIG 37 05/13/2012   Lab Results  Component Value Date   TSH 1.162  05/12/2012    Therapeutic Level Labs: No results found for: "LITHIUM" No results found for: "VALPROATE" No results found for: "CBMZ"  Current Medications: Current Outpatient Medications  Medication Sig Dispense Refill   diazepam (VALIUM) 10 MG tablet Take 1 tablet (10 mg total) by mouth every 6 (six) hours as needed for anxiety. 120 tablet 2   FLUoxetine (PROZAC) 20 MG capsule Take 1 capsule (20 mg total) by mouth daily. 30 capsule 5   levETIRAcetam (KEPPRA) 500 MG tablet Take 500 mg by mouth 2 (two) times daily.     lisinopril (PRINIVIL,ZESTRIL) 40 MG tablet Take 40 mg by mouth daily.     Multiple Vitamin (MULTIVITAMIN WITH MINERALS) TABS Take 1 tablet by mouth daily.     No current facility-administered medications for this visit.     Musculoskeletal: Strength & Muscle Tone: na Gait & Station: na Patient leans: N/A  Psychiatric Specialty Exam: Review of Systems  Psychiatric/Behavioral:  The patient is nervous/anxious.   All other systems reviewed and are negative.   There were no vitals taken for this visit.There is no height or weight on file to calculate BMI.  General Appearance: NA  Eye Contact:  NA  Speech:  Clear and Coherent  Volume:  Normal  Mood:  Anxious and Euthymic  Affect:  NA  Thought Process:  Goal Directed  Orientation:  Full (Time, Place, and Person)  Thought Content: WDL   Suicidal Thoughts:  No  Homicidal Thoughts:  No  Memory:  Immediate;   Good Recent;   Good Remote;   Good  Judgement:  Good  Insight:  Good  Psychomotor Activity:  Normal  Concentration:  Concentration: Good and Attention Span: Good  Recall:  Good  Fund of Knowledge: Good  Language: Good  Akathisia:  No  Handed:  Right  AIMS (if indicated): not done  Assets:  Communication Skills Desire for Improvement Physical Health Resilience Social Support Talents/Skills  ADL's:  Intact  Cognition: WNL  Sleep:  Good   Screenings: PHQ2-9    Flowsheet Row Video Visit from  04/27/2022 in [place] [facility] [place] Video Visit from 12/03/2021 in [place] at [place] Video Visit from 06/18/2021 in [place] at [place] Video Visit from 01/06/2021 in [place] [facility] Total Score 0 0 0 0      Flowsheet Row Video Visit from 04/27/2022 in [place] at [place] Video Visit from 12/03/2021 in [place] at [place] Video Visit from 01/06/2021 in [place] at [place] No Risk No Risk No Risk  Assessment and Plan: This patient is a 72 year old female with a history of depression and anxiety.  She continues to do well on her current regimen although she is sometimes a bit more anxious.  She will continue Prozac 20 mg daily for depression and Valium 10 mg will be increased to every 6 hours as needed for anxiety times daily for anxiety.  She will return to see me in 6 months  Collaboration of Care: Collaboration of Care: Primary Care Provider AEB notes will be shared with PCP at patient's request  Patient/Guardian was advised Release of Information must be obtained prior to any record release in order to collaborate their care with an outside provider. Patient/Guardian was advised if they have not already done so to contact the registration department to sign all necessary forms in order for [place] to release information regarding their care.   Consent: Patient/Guardian gives verbal consent for treatment and assignment of benefits for services provided during this visit. Patient/Guardian expressed understanding and agreed to proceed.    Levonne Spiller, MD 10/12/2022, 11:11 AM

## 2023-01-25 ENCOUNTER — Other Ambulatory Visit (HOSPITAL_COMMUNITY): Payer: Self-pay | Admitting: Psychiatry

## 2023-01-25 ENCOUNTER — Telehealth (HOSPITAL_COMMUNITY): Payer: Self-pay | Admitting: *Deleted

## 2023-01-25 MED ORDER — DIAZEPAM 10 MG PO TABS: 10.0000 mg | ORAL_TABLET | Freq: Four times a day (QID) | ORAL | 2 refills | Status: DC | PRN

## 2023-01-25 NOTE — Telephone Encounter (Signed)
Patient called stating that her Valium was the only medication that the pharmacy did not have. She would like for provider to please send it to [facility] in [place].

## 2023-03-29 ENCOUNTER — Telehealth (HOSPITAL_COMMUNITY): Payer: Medicare Other | Admitting: Psychiatry

## 2023-03-29 ENCOUNTER — Encounter (HOSPITAL_COMMUNITY): Payer: Self-pay | Admitting: Psychiatry

## 2023-03-29 DIAGNOSIS — F411 Generalized anxiety disorder: Secondary | ICD-10-CM | POA: Diagnosis not present

## 2023-03-29 DIAGNOSIS — Z8659 Personal history of other mental and behavioral disorders: Secondary | ICD-10-CM | POA: Diagnosis not present

## 2023-03-29 MED ORDER — ALPRAZOLAM 1 MG PO TABS: 1.0000 mg | ORAL_TABLET | Freq: Three times a day (TID) | ORAL | 5 refills | Status: DC | PRN

## 2023-03-29 MED ORDER — FLUOXETINE HCL 20 MG PO CAPS: 20.0000 mg | ORAL_CAPSULE | Freq: Every day | ORAL | 5 refills | Status: DC

## 2023-03-29 NOTE — Progress Notes (Signed)
[facility] MD/PA/NP OP Progress Note  03/29/2023 11:42 AM Maria Petty  MRN:  [id number]  Chief Complaint:  Chief Complaint  Patient presents with   Depression   Anxiety   Follow-up   HPI: This patient is a 73 year old divorced white female who lives alone in [place].  She has no children.  She is currently unemployed.  She used to run an Furniture conservator/restorer.  The patient returns after 6 months regarding her depression and anxiety.  She states that she is generally doing well.  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  She states her mood is good and she denies significant depression thoughts of self-harm or suicide.  She no longer feels the Valium is working quite as well for anxiety and would like to switch back to Xanax.  Her health has been good although she has mild anemia.  She has had no further seizures. Visit Diagnosis:    ICD-10-CM   1. Generalized anxiety disorder  F41.1     2. 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   Depression    History of seizures 05/13/2012   Hypoglycemia    Hyponatremia 05/13/2012   Macrocytic Anemia 05/13/2012   Raynaud's syndrome    Seizures (HCC)     Past Surgical History:  Procedure Laterality Date   RHINOPLASTY     TUBAL LIGATION      Family Psychiatric History: See below  Family History:  Family History  Problem Relation Age of Onset   Colon cancer Mother        32   Depression Father    Cervical cancer Maternal Aunt     Social History:  Social History   Socioeconomic History   Marital status: Divorced    Spouse name: Not on file   Number of children: Not on file   Years of education: Not on file   Highest education level: Not on file  Occupational History   Not on file  Tobacco  Use   Smoking status: Never   Smokeless tobacco: Never  Substance and Sexual Activity   Alcohol use: No    Alcohol/week: 6.0 standard drinks of alcohol    Types: 6 Cans of beer per week    Comment: history of heavy etoh abuse for 30+ years, but quit in 2015 per patient   Drug use: No   Sexual activity: Yes    Birth control/protection: Surgical  Other Topics Concern   Not on file  Social History Narrative   Not on file   Social Determinants of Health   Financial Resource Strain: Not on file  Food Insecurity: Not on file  Transportation Needs: Not on file  Physical Activity: Not on file  Stress: Not on file  Social Connections: Not on file    Allergies: No Known Allergies  Metabolic Disorder Labs: No results found for: "HGBA1C", "MPG" No results found for: "PROLACTIN" Lab Results  Component Value Date   TRIG 37 05/13/2012   Lab Results  Component Value Date   TSH 1.162 05/12/2012    Therapeutic Level Labs: No results found for: "LITHIUM" No results found for: "VALPROATE" No results found for: "CBMZ"  Current Medications: Current Outpatient Medications  Medication Sig Dispense Refill   ALPRAZolam (XANAX) 1 MG tablet Take 1 tablet (1 mg total) by mouth 3 (three) times daily as needed for anxiety. 90 tablet 5   FLUoxetine (PROZAC) 20 MG capsule Take 1 capsule (20 mg total) by mouth daily. 30 capsule 5   levETIRAcetam (KEPPRA) 500 MG tablet Take 500 mg by mouth 2 (two) times daily.     lisinopril (PRINIVIL,ZESTRIL) 40 MG tablet Take 40 mg by mouth daily.     Multiple Vitamin (MULTIVITAMIN WITH MINERALS) TABS Take 1 tablet by mouth daily.     No current facility-administered medications for this visit.     Musculoskeletal: Strength & Muscle Tone: na Gait & Station: na Patient leans: na  Psychiatric Specialty Exam: Review of Systems  All other systems reviewed and are negative.   There were no vitals taken for this visit.There is no height or weight on file  to calculate BMI.  General Appearance: NA  Eye Contact:  NA  Speech:  wnls  Volume:  Normal  Mood:  Euthymic  Affect:  Congruent  Thought Process:  Goal Directed  Orientation:  Full (Time, Place, and Person)  Thought Content: WDL   Suicidal Thoughts:  No  Homicidal Thoughts:  No  Memory:  Immediate;   Good Recent;   Good Remote;   Good  Judgement:  Good  Insight:  Good  Psychomotor Activity:  Normal  Concentration:  Concentration: Good and Attention Span: Good  Recall:  Good  Fund of Knowledge: Good  Language: Good  Akathisia:  No  Handed:  Right  AIMS (if indicated): not done  Assets:  Communication Skills Desire for Improvement Physical Health Resilience Social Support Talents/Skills  ADL's:  Intact  Cognition: WNL  Sleep:  Good   Screenings: PHQ2-9    Flowsheet Row Video Visit from 04/27/2022 in [facility] [place] Video Visit from 12/03/2021 in [facility] [place] Video Visit from 06/18/2021 in [facility] at [place] Video Visit from 01/06/2021 in [facility] Total Score 0 0 0 0      Flowsheet Row Video Visit from 04/27/2022 in [facility] at [place] Video Visit from 12/03/2021 in [facility] at [place] Video Visit from 01/06/2021 in [facility] at [place]  C-SSRS RISK CATEGORY No Risk No Risk No Risk        Assessment and Plan: This patient is a 72 year old female with a long history of depression and anxiety.  She is slightly anxious and wants to change her Valium so we will go back to Xanax 1 mg 3 times daily as needed for anxiety.  She will continue Prozac 20 mg daily for depression.  She will return to see me in 6 months  Collaboration of Care: Collaboration of Care: Primary Care Provider AEB notes will be shared with  PCP at patient's request  Patient/Guardian was advised Release of Information must be obtained prior to any record release in order to collaborate their care with an outside provider. Patient/Guardian was advised if they have not already done so to contact the registration department to sign all necessary forms in order for [place] to release information regarding their care.   Consent: Patient/Guardian gives verbal consent for treatment and assignment of benefits for services provided during this visit. Patient/Guardian expressed understanding and agreed to proceed.    Diannia Ruder, MD 03/29/2023,  11:42 AM

## 2023-07-12 ENCOUNTER — Inpatient Hospital Stay: Payer: Self-pay | Admitting: Oncology

## 2023-07-12 ENCOUNTER — Inpatient Hospital Stay: Payer: Medicare Other

## 2023-08-17 ENCOUNTER — Inpatient Hospital Stay: Payer: Medicare Other | Attending: Hematology | Admitting: Hematology

## 2023-08-17 ENCOUNTER — Inpatient Hospital Stay: Payer: Medicare Other

## 2023-08-17 VITALS — BP 137/87 | HR 70 | Temp 97.7°F | Resp 18 | Ht 66.0 in | Wt 126.2 lb

## 2023-08-17 DIAGNOSIS — Z8049 Family history of malignant neoplasm of other genital organs: Secondary | ICD-10-CM

## 2023-08-17 DIAGNOSIS — Z800 Family history of malignant neoplasm of digestive organs: Secondary | ICD-10-CM

## 2023-08-17 DIAGNOSIS — R110 Nausea: Secondary | ICD-10-CM | POA: Diagnosis not present

## 2023-08-17 DIAGNOSIS — D72819 Decreased white blood cell count, unspecified: Secondary | ICD-10-CM

## 2023-08-17 DIAGNOSIS — Z818 Family history of other mental and behavioral disorders: Secondary | ICD-10-CM

## 2023-08-17 DIAGNOSIS — Z803 Family history of malignant neoplasm of breast: Secondary | ICD-10-CM | POA: Insufficient documentation

## 2023-08-17 DIAGNOSIS — Z79899 Other long term (current) drug therapy: Secondary | ICD-10-CM

## 2023-08-17 DIAGNOSIS — R519 Headache, unspecified: Secondary | ICD-10-CM

## 2023-08-17 DIAGNOSIS — R42 Dizziness and giddiness: Secondary | ICD-10-CM

## 2023-08-17 DIAGNOSIS — F419 Anxiety disorder, unspecified: Secondary | ICD-10-CM

## 2023-08-17 DIAGNOSIS — F32A Depression, unspecified: Secondary | ICD-10-CM

## 2023-08-17 DIAGNOSIS — Z7289 Other problems related to lifestyle: Secondary | ICD-10-CM

## 2023-08-17 LAB — CBC WITH DIFFERENTIAL/PLATELET
Abs Immature Granulocytes: 0 10*3/uL (ref 0.00–0.07)
Basophils Absolute: 0 10*3/uL (ref 0.0–0.1)
Basophils Relative: 1 %
Eosinophils Absolute: 0 10*3/uL (ref 0.0–0.5)
Eosinophils Relative: 1 %
HCT: 39.3 % (ref 36.0–46.0)
Hemoglobin: 13.2 g/dL (ref 12.0–15.0)
Immature Granulocytes: 0 %
Lymphocytes Relative: 31 %
Lymphs Abs: 1.1 10*3/uL (ref 0.7–4.0)
MCH: 33.2 pg (ref 26.0–34.0)
MCHC: 33.6 g/dL (ref 30.0–36.0)
MCV: 98.7 fL (ref 80.0–100.0)
Monocytes Absolute: 0.5 10*3/uL (ref 0.1–1.0)
Monocytes Relative: 15 %
Neutro Abs: 1.8 10*3/uL (ref 1.7–7.7)
Neutrophils Relative %: 52 %
Platelets: 183 10*3/uL (ref 150–400)
RBC: 3.98 MIL/uL (ref 3.87–5.11)
RDW: 12.7 % (ref 11.5–15.5)
WBC: 3.5 10*3/uL — ABNORMAL LOW (ref 4.0–10.5)
nRBC: 0 % (ref 0.0–0.2)

## 2023-08-17 LAB — FOLATE: Folate: 12.7 ng/mL (ref >5.9)

## 2023-08-17 LAB — LACTATE DEHYDROGENASE: LDH: 152 U/L (ref 98–192)

## 2023-08-17 LAB — RETICULOCYTES
Immature Retic Fract: 8.1 % (ref 2.3–15.9)
RBC.: 3.98 MIL/uL (ref 3.87–5.11)
Retic Count, Absolute: 55.3 10*3/uL (ref 19.0–186.0)
Retic Ct Pct: 1.4 % (ref 0.4–3.1)

## 2023-08-17 LAB — VITAMIN B12: Vitamin B-12: 569 pg/mL (ref 180–914)

## 2023-08-17 NOTE — Progress Notes (Signed)
[facility] [address] [place] [address]   Clinic Day:  08/17/2023  Referring physician: Katherine Basset*  Patient Care Team: Pearson Grippe, MD as PCP - General (Internal Medicine) West Bali, MD (Inactive) as Consulting Physician (Gastroenterology)   ASSESSMENT & PLAN:   Assessment:  1.  Mild leukopenia: - Patient seen at the request of Colleen Can, NP - CBC (02/08/2023): WBC-3.2, ANC-1.4, Hb-12.2, PLT-185 - CBC (06/19/2022): WBC-3.5, Hb-12.3, PLT-179 - CBC (03/26/2019): WBC-3.2, Hb-13.2, PLT-177 - She is on B12 supplements as she is a vegetarian. - She was started on Keppra since 2013, takes 500 mg once daily. - Denies any recurrent infections.  No B symptoms.  2.  Social/family history: - Denies any chemical/pesticide exposure.  Occasional use of Roundup.  Worked for a Education officer, community and a International aid/development worker.  Quit smoking cigarettes at age 19. - No family history of leukemia.  Maternal aunt had cervical cancer.  Sister had breast cancer.  Plan:  1.  Mild leukopenia: - We discussed etiology of mild leukopenia. - We will repeat her CBC today and check for nutritional deficiencies, connective tissue disorders and bone marrow infiltrative process. - If the above tests are normal, most likely mild leukopenia from Keppra. - Recommend follow-up in 2 to 3 weeks.   Orders Placed This Encounter  Procedures   CBC with Differential    Standing Status:   Future    Number of Occurrences:   1    Expected Date:   08/17/2023    Expiration Date:   08/16/2024   Lactate dehydrogenase    Standing Status:   Future    Number of Occurrences:   1    Expected Date:   08/17/2023    Expiration Date:   08/16/2024   Protein electrophoresis, serum    Standing Status:   Future    Number of Occurrences:   1    Expected Date:   08/17/2023    Expiration Date:   08/16/2024   Immunofixation electrophoresis    Standing Status:   Future    Number of Occurrences:   1     Expected Date:   08/17/2023    Expiration Date:   08/16/2024   Kappa/lambda light chains    Standing Status:   Future    Number of Occurrences:   1    Expected Date:   08/17/2023    Expiration Date:   08/16/2024   Vitamin B12    Standing Status:   Future    Number of Occurrences:   1    Expected Date:   08/17/2023    Expiration Date:   08/16/2024   Folate    Standing Status:   Future    Number of Occurrences:   1    Expected Date:   08/17/2023    Expiration Date:   08/16/2024   Methylmalonic acid, serum    Standing Status:   Future    Number of Occurrences:   1    Expected Date:   08/17/2023    Expiration Date:   08/16/2024   Copper, serum    Standing Status:   Future    Number of Occurrences:   1    Expected Date:   08/17/2023    Expiration Date:   08/16/2024   ANA, IFA (with reflex)    Standing Status:   Future    Number of Occurrences:   1    Expected Date:   08/17/2023  Expiration Date:   08/16/2024   Rheumatoid factor    Standing Status:   Future    Number of Occurrences:   1    Expected Date:   08/17/2023    Expiration Date:   08/16/2024   Reticulocytes    Standing Status:   Future    Number of Occurrences:   1    Expected Date:   08/17/2023    Expiration Date:   08/16/2024      I,Karista Aispuro,acting as a scribe for Doreatha Massed, MD.,have documented all relevant documentation on the behalf of Doreatha Massed, MD,as directed by  Doreatha Massed, MD while in the presence of Doreatha Massed, MD.   I, Doreatha Massed MD, have reviewed the above documentation for accuracy and completeness, and I agree with the above.   Doreatha Massed, MD   12/18/20245:38 PM  CHIEF COMPLAINT/PURPOSE OF CONSULT:   Diagnosis: Mild leukopenia  Current Therapy: None  HISTORY OF PRESENT ILLNESS:   Maria Petty is a 72 y.o. female presenting to clinic today for evaluation of mild leukopenia at the request of Colleen Can, NP.  Today, she states  that she is doing well overall. Her appetite level is at 100%. Her energy level is at 50%.  She was found to have abnormal CBC from 02/08/2023 when white count was 3.2 with ANC of 1.4.  Hemoglobin was 12.2 with platelet count 185.  RBC count was also mildly low.  She denies any recurrent infections.   PAST MEDICAL HISTORY:   Past Medical History: Past Medical History:  Diagnosis Date   Acute alcoholic pancreatitis 05/13/2012   Alcohol abuse 05/13/2012   Chronic hepatitis C (HCC)    eradicated with Harvoni 2017/2018   Depression    History of seizures 05/13/2012   Hypoglycemia    Hyponatremia 05/13/2012   Macrocytic Anemia 05/13/2012   Raynaud's syndrome    Seizures (HCC)     Surgical History: Past Surgical History:  Procedure Laterality Date   RHINOPLASTY     TUBAL LIGATION      Social History: Social History   Socioeconomic History   Marital status: Divorced    Spouse name: Not on file   Number of children: Not on file   Years of education: Not on file   Highest education level: Not on file  Occupational History   Not on file  Tobacco Use   Smoking status: Never   Smokeless tobacco: Never  Substance and Sexual Activity   Alcohol use: No    Alcohol/week: 6.0 standard drinks of alcohol    Types: 6 Cans of beer per week    Comment: history of heavy etoh abuse for 30+ years, but quit in 2015 per patient   Drug use: No   Sexual activity: Yes    Birth control/protection: Surgical  Other Topics Concern   Not on file  Social History Narrative   Not on file   Social Drivers of Health   Financial Resource Strain: Not on file  Food Insecurity: Not on file  Transportation Needs: Not on file  Physical Activity: Not on file  Stress: Not on file  Social Connections: Not on file  Intimate Partner Violence: Not on file    Family History: Family History  Problem Relation Age of Onset   Colon cancer Mother        36   Depression Father    Cervical cancer Maternal  Aunt     Current Medications:  Current Outpatient Medications:  ALPRAZolam (XANAX) 1 MG tablet, Take 1 tablet (1 mg total) by mouth 3 (three) times daily as needed for anxiety., Disp: 90 tablet, Rfl: 5   FLUoxetine (PROZAC) 20 MG capsule, Take 1 capsule (20 mg total) by mouth daily., Disp: 30 capsule, Rfl: 5   levETIRAcetam (KEPPRA) 500 MG tablet, Take 500 mg by mouth daily., Disp: , Rfl:    lisinopril (PRINIVIL,ZESTRIL) 40 MG tablet, Take 40 mg by mouth daily., Disp: , Rfl:    Multiple Vitamin (MULTIVITAMIN WITH MINERALS) TABS, Take 1 tablet by mouth daily., Disp: , Rfl:    Allergies: No Known Allergies  REVIEW OF SYSTEMS:   Review of Systems  Gastrointestinal:  Positive for nausea.  Neurological:  Positive for dizziness and headaches.  Psychiatric/Behavioral:  Positive for depression. The patient is nervous/anxious.   All other systems reviewed and are negative.    VITALS:   Blood pressure 137/87, pulse 70, temperature 97.7 F (36.5 C), temperature source Tympanic, resp. rate 18, height 5\' 6"  (1.676 m), weight 126 lb 3.2 oz (57.2 kg), SpO2 100%.  Wt Readings from Last 3 Encounters:  08/17/23 126 lb 3.2 oz (57.2 kg)  09/24/16 136 lb 3.2 oz (61.8 kg)  07/05/16 140 lb 9.6 oz (63.8 kg)    Body mass index is 20.37 kg/m.   PHYSICAL EXAM:   Physical Exam Vitals reviewed.  Constitutional:      Appearance: Normal appearance.  Cardiovascular:     Rate and Rhythm: Normal rate and regular rhythm.     Heart sounds: Normal heart sounds.  Pulmonary:     Effort: Pulmonary effort is normal.     Breath sounds: Normal breath sounds.  Abdominal:     General: There is no distension.     Palpations: Abdomen is soft. There is no mass.  Lymphadenopathy:     Cervical: No cervical adenopathy.     Upper Body:     Right upper body: No supraclavicular or axillary adenopathy.     Left upper body: No supraclavicular or axillary adenopathy.     Lower Body: No right inguinal adenopathy.  No left inguinal adenopathy.  Neurological:     Mental Status: She is alert.  Psychiatric:        Mood and Affect: Mood normal.        Behavior: Behavior normal.     LABS:      Latest Ref Rng & Units 08/17/2023   11:05 AM 06/29/2016   12:16 PM 05/14/2012    4:42 AM  CBC  WBC 4.0 - 10.5 K/uL 3.5  5.3  3.9   Hemoglobin 12.0 - 15.0 g/dL 95.2  84.1  32.4   Hematocrit 36.0 - 46.0 % 39.3  40.1  31.4   Platelets 150 - 400 K/uL 183  223  267       Latest Ref Rng & Units 05/24/2017   12:44 PM 12/03/2016   11:19 AM 08/31/2016    1:46 PM  CMP  Total Protein 6.1 - 8.1 g/dL 7.0  7.1  7.7   Total Bilirubin 0.2 - 1.2 mg/dL 0.5  0.4  0.3   Alkaline Phos 33 - 130 U/L  50  51   AST 10 - 35 U/L 27  26  27    ALT 6 - 29 U/L 17  17  18       No results found for: "CEA1", "CEA" / No results found for: "CEA1", "CEA" No results found for: "PSA1" No results found for: "MWN027" No results found  for: "UYQ034"  No results found for: "TOTALPROTELP", "ALBUMINELP", "A1GS", "A2GS", "BETS", "BETA2SER", "GAMS", "MSPIKE", "SPEI" Lab Results  Component Value Date   TIBC 424 04/16/2016   FERRITIN 174 (H) 04/16/2016   IRONPCTSAT 23 04/16/2016   Lab Results  Component Value Date   LDH 152 08/17/2023     STUDIES:   No results found.

## 2023-08-17 NOTE — Patient Instructions (Signed)
You were seen and examined today by Dr. Ellin Saba. Dr. Ellin Saba is a hematologist, meaning that he specializes in blood abnormalities. Dr. Ellin Saba discussed your past medical history, family history of cancers/blood conditions and the events that led to you being here today.  You were referred to Dr. Ellin Saba due to leukopenia (low white blood cell count).  Dr. Ellin Saba has recommended additional labs today for further evaluation.  Follow-up as scheduled.

## 2023-08-18 LAB — KAPPA/LAMBDA LIGHT CHAINS
Kappa free light chain: 15 mg/L (ref 3.3–19.4)
Kappa, lambda light chain ratio: 1.33 (ref 0.26–1.65)
Lambda free light chains: 11.3 mg/L (ref 5.7–26.3)

## 2023-08-18 LAB — COPPER, SERUM: Copper: 90 ug/dL (ref 80–158)

## 2023-08-18 LAB — RHEUMATOID FACTOR: Rheumatoid fact SerPl-aCnc: 34.7 [IU]/mL — ABNORMAL HIGH (ref <14.0)

## 2023-08-20 LAB — ANTINUCLEAR ANTIBODIES, IFA: ANA Ab, IFA: NEGATIVE

## 2023-08-23 LAB — METHYLMALONIC ACID, SERUM: Methylmalonic Acid, Quantitative: 139 nmol/L (ref 0–378)

## 2023-08-28 LAB — PROTEIN ELECTROPHORESIS, SERUM
A/G Ratio: 1.4 (ref 0.7–1.7)
Albumin ELP: 4.5 g/dL — ABNORMAL HIGH (ref 2.9–4.4)
Alpha-1-Globulin: 0.3 g/dL (ref 0.0–0.4)
Alpha-2-Globulin: 1 g/dL (ref 0.4–1.0)
Beta Globulin: 0.9 g/dL (ref 0.7–1.3)
Gamma Globulin: 1.1 g/dL (ref 0.4–1.8)
Globulin, Total: 3.2 g/dL (ref 2.2–3.9)
Total Protein ELP: 7.7 g/dL (ref 6.0–8.5)

## 2023-08-30 LAB — IMMUNOFIXATION ELECTROPHORESIS
IgA: 110 mg/dL (ref 87–352)
IgG (Immunoglobin G), Serum: 963 mg/dL (ref 586–1602)
IgM (Immunoglobulin M), Srm: 168 mg/dL (ref 26–217)
Total Protein ELP: 7.6 g/dL (ref 6.0–8.5)

## 2023-09-02 ENCOUNTER — Ambulatory Visit: Payer: Medicare Other | Admitting: Oncology

## 2023-09-07 ENCOUNTER — Inpatient Hospital Stay: Payer: Medicare Other | Attending: Oncology | Admitting: Oncology

## 2023-09-07 VITALS — BP 162/88 | HR 55 | Temp 98.0°F | Resp 17

## 2023-09-07 DIAGNOSIS — Z7289 Other problems related to lifestyle: Secondary | ICD-10-CM | POA: Insufficient documentation

## 2023-09-07 DIAGNOSIS — Z803 Family history of malignant neoplasm of breast: Secondary | ICD-10-CM | POA: Diagnosis not present

## 2023-09-07 DIAGNOSIS — Z800 Family history of malignant neoplasm of digestive organs: Secondary | ICD-10-CM | POA: Insufficient documentation

## 2023-09-07 DIAGNOSIS — Z79899 Other long term (current) drug therapy: Secondary | ICD-10-CM | POA: Insufficient documentation

## 2023-09-07 DIAGNOSIS — F32A Depression, unspecified: Secondary | ICD-10-CM | POA: Diagnosis not present

## 2023-09-07 DIAGNOSIS — F419 Anxiety disorder, unspecified: Secondary | ICD-10-CM | POA: Diagnosis not present

## 2023-09-07 DIAGNOSIS — R519 Headache, unspecified: Secondary | ICD-10-CM | POA: Diagnosis not present

## 2023-09-07 DIAGNOSIS — B182 Chronic viral hepatitis C: Secondary | ICD-10-CM | POA: Insufficient documentation

## 2023-09-07 DIAGNOSIS — G479 Sleep disorder, unspecified: Secondary | ICD-10-CM | POA: Diagnosis not present

## 2023-09-07 DIAGNOSIS — D72819 Decreased white blood cell count, unspecified: Secondary | ICD-10-CM | POA: Insufficient documentation

## 2023-09-07 DIAGNOSIS — Z8049 Family history of malignant neoplasm of other genital organs: Secondary | ICD-10-CM | POA: Diagnosis not present

## 2023-09-07 DIAGNOSIS — I7300 Raynaud's syndrome without gangrene: Secondary | ICD-10-CM | POA: Insufficient documentation

## 2023-09-07 DIAGNOSIS — R112 Nausea with vomiting, unspecified: Secondary | ICD-10-CM | POA: Diagnosis not present

## 2023-09-07 DIAGNOSIS — R760 Raised antibody titer: Secondary | ICD-10-CM | POA: Insufficient documentation

## 2023-09-07 DIAGNOSIS — R768 Other specified abnormal immunological findings in serum: Secondary | ICD-10-CM | POA: Diagnosis not present

## 2023-09-07 NOTE — Progress Notes (Signed)
 [facility] [address], [place] [address]   Clinic Day:  09/07/2023  Referring physician: Luke Agent, Maria Petty  Patient Care Team: Maria Agent, Maria Petty as PCP - General (Internal Medicine) Maria Margo CROME, Maria Petty (Inactive) as Consulting Physician (Gastroenterology)   ASSESSMENT & PLAN:   Assessment:  1.  Mild leukopenia: - Patient seen at the request of Rogue Kiang, Maria Petty - CBC (02/08/2023): WBC-3.2, ANC-1.4, Hb-12.2, PLT-185 - CBC (06/19/2022): WBC-3.5, Hb-12.3, PLT-179 - CBC (03/26/2019): WBC-3.2, Hb-13.2, PLT-177 - She is on B12 supplements as she is a vegetarian. - She was started on Keppra  since 2013, takes 500 mg once daily. - Denies any recurrent infections.  No B symptoms.  2.  Social/family history: - Denies any chemical/pesticide exposure.  Occasional use of Roundup.  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 - No family history of leukemia.  Maternal aunt had cervical cancer.  Sister had breast cancer.  Plan:  1.  Mild leukopenia: - Workup from 08/17/2023 did not reveal any evidence of a bone marrow infiltrative process (SPEP WNL with normal IFE).  No evidence of autoimmune or connective tissue disorder.  Copper , B12, MMA, folate all within normal limits.  WBC remains low at 3.5 with unremarkable differential. -Etiology felt to be secondary to medication Keppra . -Recommend follow-up in 6 months with labs a few days before.  2.  Elevated rheumatoid factor: -Labs from 08/17/2023 shows in rheumatoid factor of 34.7.  ANA is normal. -No evidence of joint pain.  No family history of autoimmune disorders. -Will continue to monitor.  PLAN SUMMARY: >> Reviewed labs with patient in detail. >> Etiology felt to be secondary to Keppra . >> Return to clinic in approximately 6 months for follow-up with labs a few days before.    I spent 25 minutes dedicated to the care of this patient (face-to-face and non-face-to-face) on the date of  the encounter to include what is described in the assessment and plan.  No orders of the defined types were placed in this encounter.   Maria FORBES Hope, Maria Petty   1/8/20252:41 PM  CHIEF COMPLAINT/PURPOSE OF CONSULT:   Diagnosis: Mild leukopenia  Current Therapy: None  HISTORY OF PRESENT ILLNESS:   Maria Petty is a 73 y.o. female presenting to clinic today for evaluation of mild leukopenia the request of her PCP.  She was evaluated and worked up by Dr. Rogers on 08/17/2023.  She is here today to review labs.  She was found to have abnormal CBC from 02/08/2023 when white count was 3.2 with ANC of 1.4.  Hemoglobin was 12.2 with platelet count 185.  RBC count was also mildly low.  She denies any recurrent infections.  Today,  states that she is doing well overall. Her appetite level is at 100%. Her energy level is at 50%.  Reports when she initially saw Dr. Rogers she had just recovered from a GI bug.  Reports she is feeling much better.  Had several bouts of nausea with vomiting and diarrhea which has improved.  Reports no pain.  Has occasional headache and trouble falling and staying asleep.  Has chronic stable anxiety.  She denies any recent hospitalizations, surgeries or changes to her baseline health.   PAST MEDICAL HISTORY:   Past Medical History: Past Medical History:  Diagnosis Date   Acute alcoholic pancreatitis 05/13/2012   Alcohol abuse 05/13/2012   Chronic hepatitis C (HCC)    eradicated with Harvoni 2017/2018   Depression  History of seizures 05/13/2012   Hypoglycemia    Hyponatremia 05/13/2012   Macrocytic Anemia 05/13/2012   Raynaud's syndrome    Seizures (HCC)     Surgical History: Past Surgical History:  Procedure Laterality Date   RHINOPLASTY     TUBAL LIGATION      Social History: Social History   Socioeconomic History   Marital status: Divorced    Spouse name: Not on file   Number of children: Not on file   Years of education: Not on file   Highest  education level: Not on file  Occupational History   Not on file  Tobacco Use   Smoking status: Never   Smokeless tobacco: Never  Substance and Sexual Activity   Alcohol use: No    Alcohol/week: 6.0 standard drinks of alcohol    Types: 6 Cans of beer per week    Comment: history of heavy etoh abuse for 30+ years, but quit in 2015 per patient   Drug use: No   Sexual activity: Yes    Birth control/protection: Surgical  Other Topics Concern   Not on file  Social History Narrative   Not on file   Social Drivers of Health   Financial Resource Strain: Not on file  Food Insecurity: Not on file  Transportation Needs: Not on file  Physical Activity: Not on file  Stress: Not on file  Social Connections: Not on file  Intimate Partner Violence: Not on file    Family History: Family History  Problem Relation Age of Onset   Colon cancer Mother        66   Depression Father    Cervical cancer Maternal Aunt     Current Medications:  Current Outpatient Medications:    ALPRAZolam  (XANAX ) 1 MG tablet, Take 1 tablet (1 mg total) by mouth 3 (three) times daily as needed for anxiety., Disp: 90 tablet, Rfl: 5   FLUoxetine  (PROZAC ) 20 MG capsule, Take 1 capsule (20 mg total) by mouth daily., Disp: 30 capsule, Rfl: 5   levETIRAcetam  (KEPPRA ) 500 MG tablet, Take 500 mg by mouth daily., Disp: , Rfl:    lisinopril (PRINIVIL,ZESTRIL) 40 MG tablet, Take 40 mg by mouth daily., Disp: , Rfl:    Multiple Vitamin (MULTIVITAMIN WITH MINERALS) TABS, Take 1 tablet by mouth daily., Disp: , Rfl:    Allergies: No Known Allergies  REVIEW OF SYSTEMS:   Review of Systems  Constitutional:  Negative for fatigue.  Neurological:  Positive for headaches.  Psychiatric/Behavioral:  Positive for sleep disturbance. The patient is nervous/anxious.      VITALS:   Blood pressure (!) 162/88, pulse (!) 55, temperature 98 F (36.7 C), temperature source Oral, resp. rate 17, SpO2 98%.  Wt Readings from Last 3  Encounters:  08/17/23 126 lb 3.2 oz (57.2 kg)  09/24/16 136 lb 3.2 oz (61.8 kg)  07/05/16 140 lb 9.6 oz (63.8 kg)    There is no height or weight on file to calculate BMI.   PHYSICAL EXAM:   Physical Exam Constitutional:      Appearance: Normal appearance.  Cardiovascular:     Rate and Rhythm: Normal rate and regular rhythm.  Pulmonary:     Effort: Pulmonary effort is normal.     Breath sounds: Normal breath sounds.  Abdominal:     General: Bowel sounds are normal.     Palpations: Abdomen is soft.  Musculoskeletal:        General: No swelling. Normal range of motion.  Neurological:  Mental Status: She is alert and oriented to person, place, and time. Mental status is at baseline.     LABS:      Latest Ref Rng & Units 08/17/2023   11:05 AM 06/29/2016   12:16 PM 05/14/2012    4:42 AM  CBC  WBC 4.0 - 10.5 K/uL 3.5  5.3  3.9   Hemoglobin 12.0 - 15.0 g/dL 86.7  86.8  89.0   Hematocrit 36.0 - 46.0 % 39.3  40.1  31.4   Platelets 150 - 400 K/uL 183  223  267       Latest Ref Rng & Units 05/24/2017   12:44 PM 12/03/2016   11:19 AM 08/31/2016    1:46 PM  CMP  Total Protein 6.1 - 8.1 g/dL 7.0  7.1  7.7   Total Bilirubin 0.2 - 1.2 mg/dL 0.5  0.4  0.3   Alkaline Phos 33 - 130 U/L  50  51   AST 10 - 35 U/L 27  26  27    ALT 6 - 29 U/L 17  17  18       No results found for: CEA1, CEA / No results found for: CEA1, CEA No results found for: PSA1 No results found for: CAN199 No results found for: RJW874  Lab Results  Component Value Date   TOTALPROTELP 7.6 08/17/2023   TOTALPROTELP 7.7 08/17/2023   ALBUMINELP 4.5 (H) 08/17/2023   A1GS 0.3 08/17/2023   A2GS 1.0 08/17/2023   BETS 0.9 08/17/2023   GAMS 1.1 08/17/2023   MSPIKE Not Observed 08/17/2023   SPEI Comment 08/17/2023   Lab Results  Component Value Date   TIBC 424 04/16/2016   FERRITIN 174 (H) 04/16/2016   IRONPCTSAT 23 04/16/2016   Lab Results  Component Value Date   LDH 152 08/17/2023      STUDIES:   No results found.

## 2023-09-12 ENCOUNTER — Telehealth (HOSPITAL_COMMUNITY): Payer: Self-pay | Admitting: *Deleted

## 2023-09-12 NOTE — Telephone Encounter (Signed)
 PLease check this out with pharmacy. When did she last fill xanax and how much was dispensed. If they are out of stock does she want it sent to another pharmacy?

## 2023-09-12 NOTE — Telephone Encounter (Addendum)
 Patient called stating that the pharmacy did not have enough of the Xanax  in stock and they could offer patient a partial fill. Per pt she went and agreed but the pharmacy only gived her 6 pills. Per pt she would like for provider to please send in enough to pills to last her until her f/u appt or call in 1 month or something.

## 2023-09-13 ENCOUNTER — Other Ambulatory Visit (HOSPITAL_COMMUNITY): Payer: Self-pay | Admitting: Psychiatry

## 2023-09-28 ENCOUNTER — Encounter (HOSPITAL_COMMUNITY): Payer: Self-pay | Admitting: Psychiatry

## 2023-09-28 ENCOUNTER — Telehealth (HOSPITAL_COMMUNITY): Payer: Medicare Other | Admitting: Psychiatry

## 2023-09-28 DIAGNOSIS — Z8659 Personal history of other mental and behavioral disorders: Secondary | ICD-10-CM

## 2023-09-28 DIAGNOSIS — F411 Generalized anxiety disorder: Secondary | ICD-10-CM

## 2023-09-28 MED ORDER — FLUOXETINE HCL 20 MG PO CAPS: 20.0000 mg | ORAL_CAPSULE | Freq: Every day | ORAL | 5 refills | Status: DC

## 2023-09-28 MED ORDER — ALPRAZOLAM 1 MG PO TABS: 1.0000 mg | ORAL_TABLET | Freq: Three times a day (TID) | ORAL | 5 refills | Status: DC | PRN

## 2023-09-28 NOTE — Progress Notes (Signed)
Virtual Visit via Telephone Note  I connected with Maria Petty on 09/28/23 at  9:40 AM EST by telephone and verified that I am speaking with the correct person using two identifiers.  Location: Patient: home Provider: office   I discussed the limitations, risks, security and privacy concerns of performing an evaluation and management service by telephone and the availability of in person appointments. I also discussed with the patient that there may be a patient responsible charge related to this service. The patient expressed understanding and agreed to proceed.    I discussed the assessment and treatment plan with the patient. The patient was provided an opportunity to ask questions and all were answered. The patient agreed with the plan and demonstrated an understanding of the instructions.   The patient was advised to call back or seek an in-person evaluation if the symptoms worsen or if the condition fails to improve as anticipated.  I provided 20 minutes of non-face-to-face time during this encounter.   Maria Ruder, MD  [facility] MD/PA/NP OP Progress Note  09/28/2023 10:07 AM Maria Petty  MRN:  [id number]  Chief Complaint:  Chief Complaint  Patient presents with   Anxiety   Depression   Follow-up   HPI: This patient is a 73 year old divorced white female who lives alone in [place].  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  She states that she has been more stressed lately.  She was found to have leukopenia and had a workup done in heme-onc but her WBC was only down to 3.5.  All her tests were normal and it was thought to be secondary to Keppra.  Since she has not had a seizure in 10 years I urged her to ask her PCP about possibly tapering it off.  The Xanax is helping her anxiety for the most part but she asked if we can go up to 4 mg daily.  I explained that this is not good in her age group  and could lead to cognitive decline.  She states she is very stressed about the current political climate in the country but I reminded her that we cannot change that by taking more medication.  I urged her to talk to more likeminded people.  For the most part she is sleeping okay and denies significant depression and thoughts of or thoughts of self-harm. Visit Diagnosis:    ICD-10-CM   1. Generalized anxiety disorder  F41.1     2. 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   Depression    History of seizures 05/13/2012   Hypoglycemia    Hyponatremia 05/13/2012   Macrocytic Anemia 05/13/2012   Raynaud's syndrome    Seizures (HCC)     Past Surgical History:  Procedure Laterality Date   RHINOPLASTY     TUBAL LIGATION      Family Psychiatric History: See below  Family History:  Family History  Problem Relation Age of Onset   Colon cancer Mother        3   Depression Father    Cervical cancer Maternal Aunt     Social History:  Social History   Socioeconomic History  Marital status: Divorced    Spouse name: Not on file   Number of children: Not on file   Years of education: Not on file   Highest education level: Not on file  Occupational History   Not on file  Tobacco Use   Smoking status: Never   Smokeless tobacco: Never  Substance and Sexual Activity   Alcohol use: No    Alcohol/week: 6.0 standard drinks of alcohol    Types: 6 Cans of beer per week    Comment: history of heavy etoh abuse for 30+ years, but quit in 2015 per patient   Drug use: No   Sexual activity: Yes    Birth control/protection: Surgical  Other Topics Concern   Not on file  Social History Narrative   Not on file   Social Drivers of Health   Financial Resource Strain: Not on  file  Food Insecurity: Not on file  Transportation Needs: Not on file  Physical Activity: Not on file  Stress: Not on file  Social Connections: Not on file    Allergies: No Known Allergies  Metabolic Disorder Labs: No results found for: "HGBA1C", "MPG" No results found for: "PROLACTIN" Lab Results  Component Value Date   TRIG 37 05/13/2012   Lab Results  Component Value Date   TSH 1.162 05/12/2012    Therapeutic Level Labs: No results found for: "LITHIUM" No results found for: "VALPROATE" No results found for: "CBMZ"  Current Medications: Current Outpatient Medications  Medication Sig Dispense Refill   mupirocin ointment (BACTROBAN) 2 % SMARTSIG:sparingly Topical 3 Times Daily     ALPRAZolam (XANAX) 1 MG tablet Take 1 tablet (1 mg total) by mouth 3 (three) times daily as needed for anxiety. 90 tablet 5   FLUoxetine (PROZAC) 20 MG capsule Take 1 capsule (20 mg total) by mouth daily. 30 capsule 5   levETIRAcetam (KEPPRA) 500 MG tablet Take 500 mg by mouth daily.     lisinopril (PRINIVIL,ZESTRIL) 40 MG tablet Take 40 mg by mouth daily.     Multiple Vitamin (MULTIVITAMIN WITH MINERALS) TABS Take 1 tablet by mouth daily.     No current facility-administered medications for this visit.     Musculoskeletal: Strength & Muscle Tone: na Gait & Station: na Patient leans: N/A  Psychiatric Specialty Exam: Review of Systems  Psychiatric/Behavioral:  The patient is nervous/anxious.   All other systems reviewed and are negative.   There were no vitals taken for this visit.There is no height or weight on file to calculate BMI.  General Appearance: NA  Eye Contact:  NA  Speech:  Clear and Coherent  Volume:  Normal  Mood:  Anxious and Euthymic  Affect:  NA  Thought Process:  Goal Directed  Orientation:  Full (Time, Place, and Person)  Thought Content: Rumination   Suicidal Thoughts:  No  Homicidal Thoughts:  No  Memory:  Immediate;   Good Recent;   Good Remote;   Good   Judgement:  Good  Insight:  Good  Psychomotor Activity:  Normal  Concentration:  Concentration: Good and Attention Span: Good  Recall:  Good  Fund of Knowledge: Good  Language: Good  Akathisia:  No  Handed:  Right  AIMS (if indicated): not done  Assets:  Communication Skills Desire for Improvement Physical Health Resilience Social Support  ADL's:  Intact  Cognition: WNL  Sleep:  Fair   Screenings: PHQ2-9    Flowsheet Row Video Visit from 04/27/2022 in [facility] at [place]  Video Visit from 12/03/2021 in [facility] at [place] Video Visit from 06/18/2021 in [facility] at [place] Video Visit from 01/06/2021 in [facility] at [facility] Total Score 0 0 0 0      Flowsheet Row Video Visit from 04/27/2022 in [facility] Outpatient Behavior[facility] at [place] Video Visit from 12/03/2021 in [facility] at [place] Video Visit from 01/06/2021 in [facility] at [place]  C-SSRS RISK CATEGORY No Risk No Risk No Risk        Assessment and Plan: This patient is a 73 year old female with a long history of depression and anxiety.  For the most part she is doing okay with the Xanax 1 mg 3 times daily for anxiety.  I explained that we cannot increase it at her age.  She will continue Prozac 20 mg daily for depression.  She will return to see me in 6 months  Collaboration of Care: Collaboration of Care: Primary Care Provider AEB notes will be shared with PCP at patient's request  Patient/Guardian was advised Release of Information must be obtained prior to any record release in order to collaborate their care with an outside provider. Patient/Guardian was advised if they have not already done so to contact the registration department to sign all necessary forms in order for [place] to release information  regarding their care.   Consent: Patient/Guardian gives verbal consent for treatment and assignment of benefits for services provided during this visit. Patient/Guardian expressed understanding and agreed to proceed.    Maria Ruder, MD 09/28/2023, 10:07 AM

## 2023-12-01 ENCOUNTER — Other Ambulatory Visit (HOSPITAL_COMMUNITY): Payer: Self-pay | Admitting: Adult Health

## 2023-12-01 DIAGNOSIS — Z1231 Encounter for screening mammogram for malignant neoplasm of breast: Secondary | ICD-10-CM

## 2023-12-15 ENCOUNTER — Encounter (HOSPITAL_COMMUNITY): Payer: Self-pay

## 2023-12-15 ENCOUNTER — Ambulatory Visit (HOSPITAL_COMMUNITY)
Admission: RE | Admit: 2023-12-15 | Discharge: 2023-12-15 | Disposition: A | Discharge status: Home / Self Care | Source: Ambulatory Visit | Attending: Adult Health | Admitting: Adult Health

## 2023-12-15 DIAGNOSIS — Z1231 Encounter for screening mammogram for malignant neoplasm of breast: Secondary | ICD-10-CM | POA: Diagnosis present

## 2024-01-02 ENCOUNTER — Other Ambulatory Visit (HOSPITAL_COMMUNITY): Payer: Self-pay | Admitting: Adult Health

## 2024-01-02 DIAGNOSIS — R928 Other abnormal and inconclusive findings on diagnostic imaging of breast: Secondary | ICD-10-CM

## 2024-01-10 ENCOUNTER — Encounter (HOSPITAL_COMMUNITY): Payer: Self-pay

## 2024-01-10 ENCOUNTER — Ambulatory Visit (HOSPITAL_COMMUNITY)

## 2024-01-10 ENCOUNTER — Encounter (HOSPITAL_COMMUNITY)

## 2024-02-02 ENCOUNTER — Encounter (HOSPITAL_COMMUNITY): Payer: Self-pay

## 2024-02-02 ENCOUNTER — Ambulatory Visit (HOSPITAL_COMMUNITY)
Admission: RE | Admit: 2024-02-02 | Discharge: 2024-02-02 | Disposition: A | Discharge status: Home / Self Care | Source: Ambulatory Visit | Attending: Adult Health | Admitting: Adult Health

## 2024-02-02 ENCOUNTER — Ambulatory Visit (HOSPITAL_COMMUNITY)
Admission: RE | Admit: 2024-02-02 | Discharge: 2024-02-02 | Disposition: A | Discharge status: Home / Self Care | Source: Ambulatory Visit | Attending: Adult Health

## 2024-02-02 DIAGNOSIS — R928 Other abnormal and inconclusive findings on diagnostic imaging of breast: Secondary | ICD-10-CM | POA: Diagnosis present

## 2024-02-29 ENCOUNTER — Telehealth (HOSPITAL_COMMUNITY): Admitting: Psychiatry

## 2024-02-29 ENCOUNTER — Encounter (HOSPITAL_COMMUNITY): Payer: Self-pay | Admitting: Psychiatry

## 2024-02-29 DIAGNOSIS — Z8659 Personal history of other mental and behavioral disorders: Secondary | ICD-10-CM

## 2024-02-29 DIAGNOSIS — F411 Generalized anxiety disorder: Secondary | ICD-10-CM | POA: Diagnosis not present

## 2024-02-29 MED ORDER — FLUOXETINE HCL 20 MG PO CAPS: 20.0000 mg | ORAL_CAPSULE | Freq: Every day | ORAL | 5 refills | Status: DC

## 2024-02-29 MED ORDER — ALPRAZOLAM 1 MG PO TABS: 1.0000 mg | ORAL_TABLET | Freq: Three times a day (TID) | ORAL | 5 refills | Status: DC | PRN

## 2024-02-29 NOTE — Progress Notes (Signed)
 Virtual Visit via Telephone Note  I connected with Maria Petty on 02/29/24 at  9:40 AM EDT by telephone and verified that I am speaking with the correct person using two identifiers.  Location: Patient: home Provider: office   I discussed the limitations, risks, security and privacy concerns of performing an evaluation and management service by telephone and the availability of in person appointments. I also discussed with the patient that there may be a patient responsible charge related to this service. The patient expressed understanding and agreed to proceed.      I discussed the assessment and treatment plan with the patient. The patient was provided an opportunity to ask questions and all were answered. The patient agreed with the plan and demonstrated an understanding of the instructions.   The patient was advised to call back or seek an in-person evaluation if the symptoms worsen or if the condition fails to improve as anticipated.  I provided 20 minutes of non-face-to-face time during this encounter.   Barnie Gull, MD  [facility] MD/PA/NP OP Progress Note  02/29/2024 10:03 AM Maria Petty  MRN:  [id number]  Chief Complaint:  Chief Complaint  Patient presents with   Depression   Anxiety   Follow-up   HPI: This patient is a 73 year old divorced white female who lives alone in [place].  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  The patient returns after 6 months regarding her depression and anxiety.  Overall she is doing well.  She had a recent physical and most of her labs were good although her sodium was a bit low.  She still has a leukopenia and is going to be seeing oncology this month.  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  She is vegetarian and follows a healthy diet.  She denies significant depression anxiety mood changes.  She sleeps well.  She mentioned concerns about dementia as her mother suffered this but she does not  have any problems with short-term memory at this time. Visit Diagnosis:    ICD-10-CM   1. Generalized anxiety disorder  F41.1     2. 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   Depression    History of seizures 05/13/2012   Hypoglycemia    Hyponatremia 05/13/2012   Macrocytic Anemia 05/13/2012   Raynaud's syndrome    Seizures (HCC)     Past Surgical History:  Procedure Laterality Date   RHINOPLASTY     TUBAL LIGATION      Family Psychiatric History: See below  Family History:  Family History  Problem Relation Age of Onset   Colon cancer Mother        78   Depression Father    Cervical cancer Maternal Aunt     Social History:  Social History   Socioeconomic History   Marital status: Divorced    Spouse name: Not on file   Number of children: Not on file   Years of education: Not on file   Highest education level: Not on file  Occupational History   Not on file  Tobacco Use   Smoking status: Never   Smokeless tobacco: Never  Substance and Sexual Activity   Alcohol  use: No    Alcohol/week: 6.0 standard drinks of alcohol    Types: 6 Cans of beer per week    Comment: history of heavy etoh abuse for 30+ years, but quit in 2015 per patient   Drug use: No   Sexual activity: Yes    Birth control/protection: Surgical  Other Topics Concern   Not on file  Social History Narrative   Not on file   Social Drivers of Health   Financial Resource Strain: Not on file  Food Insecurity: Not on file  Transportation Needs: Not on file  Physical Activity: Not on file  Stress: Not on file  Social Connections: Not on file    Allergies: No Known Allergies  Metabolic Disorder Labs: No results found for: HGBA1C, MPG No results found for:  PROLACTIN Lab Results  Component Value Date   TRIG 37 05/13/2012   Lab Results  Component Value Date   TSH 1.162 05/12/2012    Therapeutic Level Labs: No results found for: LITHIUM No results found for: VALPROATE No results found for: CBMZ  Current Medications: Current Outpatient Medications  Medication Sig Dispense Refill   ALPRAZolam  (XANAX ) 1 MG tablet Take 1 tablet (1 mg total) by mouth 3 (three) times daily as needed for anxiety. 90 tablet 5   FLUoxetine  (PROZAC ) 20 MG capsule Take 1 capsule (20 mg total) by mouth daily. 30 capsule 5   levETIRAcetam  (KEPPRA ) 500 MG tablet Take 500 mg by mouth daily.     lisinopril (PRINIVIL,ZESTRIL) 40 MG tablet Take 40 mg by mouth daily.     Multiple Vitamin (MULTIVITAMIN WITH MINERALS) TABS Take 1 tablet by mouth daily.     mupirocin ointment (BACTROBAN) 2 % SMARTSIG:sparingly Topical 3 Times Daily     No current facility-administered medications for this visit.     Musculoskeletal: Strength & Muscle Tone: within normal limits Gait & Station: normal Patient leans: N/A  Psychiatric Specialty Exam: Review of Systems  All other systems reviewed and are negative.   There were no vitals taken for this visit.There is no height or weight on file to calculate BMI.  General Appearance: Casual and Fairly Groomed  Eye Contact:  Good  Speech:  Clear and Coherent  Volume:  Normal  Mood:  Euthymic  Affect:  Congruent  Thought Process:  Goal Directed  Orientation:  Full (Time, Place, and Person)  Thought Content: WDL   Suicidal Thoughts:  No  Homicidal Thoughts:  No  Memory:  Immediate;   Good Recent;   Good Remote;   Good  Judgement:  Good  Insight:  Good  Psychomotor Activity:  Normal  Concentration:  Concentration: Good and Attention Span: Good  Recall:  Good  Fund of Knowledge: Good  Language: Good  Akathisia:  No  Handed:  Right  AIMS (if indicated): not done  Assets:  Communication Skills Desire for  Improvement Physical Health Resilience Social Support Talents/Skills  ADL's:  Intact  Cognition: WNL  Sleep:  Good   Screenings: PHQ2-9    Flowsheet Row Video Visit from 04/27/2022 in [facility] at [place] Video Visit from 12/03/2021 in [facility] at [place] Video Visit from 06/18/2021 in [facility] at [place] Video Visit from 01/06/2021 in [facility] at [facility] Total Score 0 0 0 0   Flowsheet Row Video Visit from 04/27/2022 in [facility] at [place] Video Visit from 12/03/2021 in [facility] at [facility] Video Visit from 01/06/2021 in [facility] Outpatient Behavioral Health at [place]  C-SSRS RISK CATEGORY No Risk No Risk No Risk     Assessment and Plan: This patient is a 73 year old female with a long history of depression and anxiety.  She is doing well on her current regimen.  She will continue Xanax  1 mg 3 times daily for anxiety and Prozac  20 mg daily for depression.  She will return to see me in 6 months  Collaboration of Care: Collaboration of Care: Primary Care Provider AEB notes will be shared with PCP at patient's request  Patient/Guardian was advised Release of Information must be obtained prior to any record release in order to collaborate their care with an outside provider. Patient/Guardian was advised if they have not already done so to contact the registration department to sign all necessary forms in order for us  to release information regarding their care.   Consent: Patient/Guardian gives verbal consent for treatment and assignment of benefits for services provided during this visit. Patient/Guardian expressed understanding and agreed to proceed.    Barnie Gull, MD 02/29/2024, 10:03 AM

## 2024-03-07 ENCOUNTER — Other Ambulatory Visit: Payer: Self-pay

## 2024-03-07 DIAGNOSIS — D72819 Decreased white blood cell count, unspecified: Secondary | ICD-10-CM

## 2024-03-08 ENCOUNTER — Inpatient Hospital Stay: Payer: Medicare Other | Attending: Oncology

## 2024-03-08 DIAGNOSIS — F32A Depression, unspecified: Secondary | ICD-10-CM | POA: Diagnosis not present

## 2024-03-08 DIAGNOSIS — F419 Anxiety disorder, unspecified: Secondary | ICD-10-CM | POA: Insufficient documentation

## 2024-03-08 DIAGNOSIS — D72819 Decreased white blood cell count, unspecified: Secondary | ICD-10-CM | POA: Insufficient documentation

## 2024-03-08 DIAGNOSIS — Z8049 Family history of malignant neoplasm of other genital organs: Secondary | ICD-10-CM | POA: Insufficient documentation

## 2024-03-08 DIAGNOSIS — Z800 Family history of malignant neoplasm of digestive organs: Secondary | ICD-10-CM | POA: Insufficient documentation

## 2024-03-08 DIAGNOSIS — Z818 Family history of other mental and behavioral disorders: Secondary | ICD-10-CM | POA: Diagnosis not present

## 2024-03-08 DIAGNOSIS — Z79899 Other long term (current) drug therapy: Secondary | ICD-10-CM | POA: Insufficient documentation

## 2024-03-08 DIAGNOSIS — Z803 Family history of malignant neoplasm of breast: Secondary | ICD-10-CM | POA: Insufficient documentation

## 2024-03-08 LAB — COMPREHENSIVE METABOLIC PANEL WITH GFR
ALT: 16 U/L (ref 0–44)
AST: 24 U/L (ref 15–41)
Albumin: 4.6 g/dL (ref 3.5–5.0)
Alkaline Phosphatase: 33 U/L — ABNORMAL LOW (ref 38–126)
Anion gap: 11 (ref 5–15)
BUN: 6 mg/dL — ABNORMAL LOW (ref 8–23)
CO2: 28 mmol/L (ref 22–32)
Calcium: 9.5 mg/dL (ref 8.9–10.3)
Chloride: 94 mmol/L — ABNORMAL LOW (ref 98–111)
Creatinine, Ser: 0.5 mg/dL (ref 0.44–1.00)
GFR, Estimated: >60 mL/min (ref >60)
Glucose, Bld: 95 mg/dL (ref 70–99)
Potassium: 3.9 mmol/L (ref 3.5–5.1)
Sodium: 133 mmol/L — ABNORMAL LOW (ref 135–145)
Total Bilirubin: 0.7 mg/dL (ref 0.0–1.2)
Total Protein: 7 g/dL (ref 6.5–8.1)

## 2024-03-08 LAB — CBC WITH DIFFERENTIAL/PLATELET
Abs Immature Granulocytes: 0.01 K/uL (ref 0.00–0.07)
Basophils Absolute: 0 K/uL (ref 0.0–0.1)
Basophils Relative: 1 %
Eosinophils Absolute: 0 K/uL (ref 0.0–0.5)
Eosinophils Relative: 1 %
HCT: 38.1 % (ref 36.0–46.0)
Hemoglobin: 12.7 g/dL (ref 12.0–15.0)
Immature Granulocytes: 0 %
Lymphocytes Relative: 32 %
Lymphs Abs: 1.1 K/uL (ref 0.7–4.0)
MCH: 33.2 pg (ref 26.0–34.0)
MCHC: 33.3 g/dL (ref 30.0–36.0)
MCV: 99.7 fL (ref 80.0–100.0)
Monocytes Absolute: 0.5 K/uL (ref 0.1–1.0)
Monocytes Relative: 14 %
Neutro Abs: 1.8 K/uL (ref 1.7–7.7)
Neutrophils Relative %: 52 %
Platelets: 174 K/uL (ref 150–400)
RBC: 3.82 MIL/uL — ABNORMAL LOW (ref 3.87–5.11)
RDW: 13.2 % (ref 11.5–15.5)
WBC: 3.4 K/uL — ABNORMAL LOW (ref 4.0–10.5)
nRBC: 0 % (ref 0.0–0.2)

## 2024-03-15 ENCOUNTER — Inpatient Hospital Stay (HOSPITAL_BASED_OUTPATIENT_CLINIC_OR_DEPARTMENT_OTHER): Payer: Medicare Other | Admitting: Oncology

## 2024-03-15 VITALS — BP 139/82 | HR 55 | Temp 98.0°F | Resp 18 | Wt 122.6 lb

## 2024-03-15 DIAGNOSIS — D72819 Decreased white blood cell count, unspecified: Secondary | ICD-10-CM

## 2024-03-15 NOTE — Progress Notes (Signed)
 [facility] [address] [place] [address]   Clinic Day:  03/15/2024  Referring physician: Luke Agent, MD  Patient Care Team: Luke Agent, MD (Inactive) as PCP - General (Internal Medicine) Harvey Margo CROME, MD (Inactive) as Consulting Physician (Gastroenterology)   ASSESSMENT & PLAN:   Assessment:  1.  Mild leukopenia: - Patient seen at the request of Rogue Kiang, NP - CBC (02/08/2023): WBC-3.2, ANC-1.4, Hb-12.2, PLT-185 - CBC (06/19/2022): WBC-3.5, Hb-12.3, PLT-179 - CBC (03/26/2019): WBC-3.2, Hb-13.2, PLT-177 - She is on B12 supplements as she is a vegetarian. - She was started on Keppra  since 2013, takes 500 mg once daily. - Denies any recurrent infections.  No B symptoms.  2.  Social/family history: - Denies any chemical/pesticide exposure.  Occasional use of Roundup.  Worked for a Education officer, community and a International aid/development worker.  Quit smoking cigarettes at age 42. - No family history of leukemia.  Maternal aunt had cervical cancer.  Sister had breast cancer.  Plan:  1.  Mild leukopenia: - Hematology workup from 08/17/2023 did not reveal any evidence of a bone marrow infiltrative process (SPEP WNL with normal IFE).  No evidence of autoimmune or connective tissue disorder.  Copper , B12, MMA, folate all within normal limits.  WBC remains low at 3.5 with unremarkable differential. -Repeat labs from 03/08/2024 show white blood cell count of 3.4 (3.5) with otherwise normal differential. -Etiology felt to be secondary to medication Keppra . - Given stability of her labs, no additional follow-up needed here in otology.  PCP to draw labs moving forward.  2.  Elevated rheumatoid factor: -Labs from 08/17/2023 shows in rheumatoid factor of 34.7.  ANA is normal. -No evidence of joint pain.  No family history of autoimmune disorders. -Will continue to monitor.  PLAN SUMMARY: >> PCP to monitor labs moving forward. >> Etiology felt to be secondary to Keppra . >> No additional  follow-up needed.    I spent 25 minutes dedicated to the care of this patient (face-to-face and non-face-to-face) on the date of the encounter to include what is described in the assessment and plan.  No orders of the defined types were placed in this encounter.   Delon FORBES Hope, NP   7/17/20251:10 PM  CHIEF COMPLAINT/PURPOSE OF CONSULT:   Diagnosis: Mild leukopenia  Current Therapy: None  HISTORY OF PRESENT ILLNESS:   Maria Petty is a 73 y.o. female presenting to clinic today for evaluation of mild leukopenia the request of her PCP.  She was evaluated by me on 09/07/2023.  She was found to have abnormal CBC from 02/08/2023 when white count was 3.2 with ANC of 1.4.  Hemoglobin was 12.2 with platelet count 185.  RBC count was also mildly low.  She denies any recurrent infections.  Reports since her last visit, she has done well.  Denies any pain she has chronic but stable anxiety and depression.  She is followed by psychiatrist reports she is slowly starting to wean off Keppra .  Her neurologist her to take it every other day and she has further weaned down to 3 times per week.  She is hoping to come off of this completely.  She has not had a seizure since 2014.  She denies any recent hospitalizations, surgeries or changes to her baseline health.   PAST MEDICAL HISTORY:   Past Medical History: Past Medical History:  Diagnosis Date   Acute alcoholic pancreatitis 05/13/2012   Alcohol abuse 05/13/2012   Chronic hepatitis C (HCC)    eradicated with Harvoni  2017/2018   Depression    History of seizures 05/13/2012   Hypoglycemia    Hyponatremia 05/13/2012   Macrocytic Anemia 05/13/2012   Raynaud's syndrome    Seizures (HCC)     Surgical History: Past Surgical History:  Procedure Laterality Date   RHINOPLASTY     TUBAL LIGATION      Social History: Social History   Socioeconomic History   Marital status: Divorced    Spouse name: Not on file   Number of children: Not on file    Years of education: Not on file   Highest education level: Not on file  Occupational History   Not on file  Tobacco Use   Smoking status: Never   Smokeless tobacco: Never  Substance and Sexual Activity   Alcohol use: No    Alcohol/week: 6.0 standard drinks of alcohol    Types: 6 Cans of beer per week    Comment: history of heavy etoh abuse for 30+ years, but quit in 2015 per patient   Drug use: No   Sexual activity: Yes    Birth control/protection: Surgical  Other Topics Concern   Not on file  Social History Narrative   Not on file   Social Drivers of Health   Financial Resource Strain: Not on file  Food Insecurity: Not on file  Transportation Needs: Not on file  Physical Activity: Not on file  Stress: Not on file  Social Connections: Not on file  Intimate Partner Violence: Not on file    Family History: Family History  Problem Relation Age of Onset   Colon cancer Mother        50   Depression Father    Cervical cancer Maternal Aunt     Current Medications:  Current Outpatient Medications:    ALPRAZolam  (XANAX ) 1 MG tablet, Take 1 tablet (1 mg total) by mouth 3 (three) times daily as needed for anxiety., Disp: 90 tablet, Rfl: 5   FLUoxetine  (PROZAC ) 20 MG capsule, Take 1 capsule (20 mg total) by mouth daily., Disp: 30 capsule, Rfl: 5   levETIRAcetam  (KEPPRA ) 500 MG tablet, Take 500 mg by mouth daily., Disp: , Rfl:    lisinopril (PRINIVIL,ZESTRIL) 40 MG tablet, Take 40 mg by mouth daily., Disp: , Rfl:    Multiple Vitamin (MULTIVITAMIN WITH MINERALS) TABS, Take 1 tablet by mouth daily., Disp: , Rfl:    mupirocin ointment (BACTROBAN) 2 %, SMARTSIG:sparingly Topical 3 Times Daily, Disp: , Rfl:    Allergies: No Known Allergies  REVIEW OF SYSTEMS:   Review of Systems  Psychiatric/Behavioral:  Positive for depression. The patient is nervous/anxious.      VITALS:   Blood pressure 139/82, pulse (!) 55, temperature 98 F (36.7 C), temperature source Oral, resp.  rate 18, weight 122 lb 9.2 oz (55.6 kg), SpO2 98%.  Wt Readings from Last 3 Encounters:  03/15/24 122 lb 9.2 oz (55.6 kg)  08/17/23 126 lb 3.2 oz (57.2 kg)  09/24/16 136 lb 3.2 oz (61.8 kg)    Body mass index is 19.78 kg/m.   PHYSICAL EXAM:   Physical Exam Constitutional:      Appearance: Normal appearance.  Cardiovascular:     Rate and Rhythm: Normal rate and regular rhythm.  Pulmonary:     Effort: Pulmonary effort is normal.     Breath sounds: Normal breath sounds.  Abdominal:     General: Bowel sounds are normal.     Palpations: Abdomen is soft.  Musculoskeletal:  General: No swelling. Normal range of motion.  Neurological:     Mental Status: She is alert and oriented to person, place, and time. Mental status is at baseline.     LABS:      Latest Ref Rng & Units 03/08/2024   10:30 AM 08/17/2023   11:05 AM 06/29/2016   12:16 PM  CBC  WBC 4.0 - 10.5 K/uL 3.4  3.5  5.3   Hemoglobin 12.0 - 15.0 g/dL 87.2  86.7  86.8   Hematocrit 36.0 - 46.0 % 38.1  39.3  40.1   Platelets 150 - 400 K/uL 174  183  223       Latest Ref Rng & Units 03/08/2024   10:30 AM 05/24/2017   12:44 PM 12/03/2016   11:19 AM  CMP  Glucose 70 - 99 mg/dL 95     BUN 8 - 23 mg/dL 6     Creatinine 9.55 - 1.00 mg/dL 9.49     Sodium 864 - 854 mmol/L 133     Potassium 3.5 - 5.1 mmol/L 3.9     Chloride 98 - 111 mmol/L 94     CO2 22 - 32 mmol/L 28     Calcium 8.9 - 10.3 mg/dL 9.5     Total Protein 6.5 - 8.1 g/dL 7.0  7.0  7.1   Total Bilirubin 0.0 - 1.2 mg/dL 0.7  0.5  0.4   Alkaline Phos 38 - 126 U/L 33   50   AST 15 - 41 U/L 24  27  26    ALT 0 - 44 U/L 16  17  17       No results found for: CEA1, CEA / No results found for: CEA1, CEA No results found for: PSA1 No results found for: CAN199 No results found for: RJW874  Lab Results  Component Value Date   TOTALPROTELP 7.6 08/17/2023   TOTALPROTELP 7.7 08/17/2023   ALBUMINELP 4.5 (H) 08/17/2023   A1GS 0.3 08/17/2023   A2GS  1.0 08/17/2023   BETS 0.9 08/17/2023   GAMS 1.1 08/17/2023   MSPIKE Not Observed 08/17/2023   SPEI Comment 08/17/2023   Lab Results  Component Value Date   TIBC 424 04/16/2016   FERRITIN 174 (H) 04/16/2016   IRONPCTSAT 23 04/16/2016   Lab Results  Component Value Date   LDH 152 08/17/2023     STUDIES:   No results found.

## 2024-05-30 ENCOUNTER — Telehealth (HOSPITAL_COMMUNITY): Payer: Self-pay

## 2024-05-30 NOTE — Telephone Encounter (Signed)
 Called to schedule follow up with dr okey no answer left vm

## 2024-09-14 ENCOUNTER — Telehealth (HOSPITAL_COMMUNITY): Admitting: Psychiatry

## 2024-09-14 ENCOUNTER — Encounter (HOSPITAL_COMMUNITY): Payer: Self-pay | Admitting: Psychiatry

## 2024-09-14 DIAGNOSIS — F329 Major depressive disorder, single episode, unspecified: Secondary | ICD-10-CM | POA: Diagnosis not present

## 2024-09-14 DIAGNOSIS — F411 Generalized anxiety disorder: Secondary | ICD-10-CM | POA: Diagnosis not present

## 2024-09-14 MED ORDER — ALPRAZOLAM 1 MG PO TABS: 1.0000 mg | ORAL_TABLET | Freq: Three times a day (TID) | ORAL | 5 refills | Status: AC | PRN

## 2024-09-14 MED ORDER — FLUOXETINE HCL 20 MG PO CAPS: 20.0000 mg | ORAL_CAPSULE | Freq: Every day | ORAL | 5 refills | Status: AC

## 2024-09-14 NOTE — Progress Notes (Signed)
 Virtual Visit via Telephone Note  I connected with Maria Petty on 09/14/24 at 11:00 AM EST by telephone and verified that I am speaking with the correct person using two identifiers.  Location: Patient: home Provider: office   I discussed the limitations, risks, security and privacy concerns of performing an evaluation and management service by telephone and the availability of in person appointments. I also discussed with the patient that there may be a patient responsible charge related to this service. The patient expressed understanding and agreed to proceed.       I discussed the assessment and treatment plan with the patient. The patient was provided an opportunity to ask questions and all were answered. The patient agreed with the plan and demonstrated an understanding of the instructions.   The patient was advised to call back or seek an in-person evaluation if the symptoms worsen or if the condition fails to improve as anticipated.  I provided 20 minutes of non-face-to-face time during this encounter.   Barnie Gull, MD  [facility] MD/PA/NP OP Progress Note  09/14/2024 11:14 AM Maria Petty  MRN:  [id number]  Chief Complaint:  Chief Complaint  Patient presents with   Anxiety   Depression   Follow-up   HPI: This patient is a 74 year old divorced white female who lives alone in [place]. 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  Overall she is doing well.  She is trying to stay busy around her house stacking firewood and taking care of her property.  She denies significant depression thoughts of self-harm or suicide.  She feels that her anxiety is well-controlled.  She denies any new health issues.  Visit Diagnosis:    ICD-10-CM   1. 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   Depression    History of seizures 05/13/2012   Hypoglycemia    Hyponatremia 05/13/2012   Macrocytic Anemia 05/13/2012   Raynaud's syndrome    Seizures (HCC)     Past Surgical History:  Procedure Laterality Date   RHINOPLASTY     TUBAL LIGATION      Family Psychiatric History: See below  Family History:  Family History  Problem Relation Age of Onset   Colon cancer Mother        36   Depression Father    Cervical cancer Maternal Aunt     Social History:  Social History   Socioeconomic History   Marital status: Divorced    Spouse name: Not on file   Number of children: Not on file   Years of education: Not on file   Highest education level: Not on file  Occupational History   Not on file  Tobacco Use   Smoking status: Never   Smokeless tobacco: Never  Substance and Sexual Activity   Alcohol use: No    Alcohol/week: 6.0 standard drinks of alcohol    Types: 6 Cans of beer per week    Comment: history of heavy etoh abuse for 30+ years, but quit in 2015 per patient   Drug use: No   Sexual activity: Yes    Birth control/protection: Surgical  Other  Topics Concern   Not on file  Social History Narrative   Not on file   Social Drivers of Health   Tobacco Use: Low Risk (09/14/2024)   Patient History    Smoking Tobacco Use: Never    Smokeless Tobacco Use: Never    Passive Exposure: Not on file  Financial Resource Strain: Not on file  Food Insecurity: Not on file  Transportation Needs: Not on file  Physical Activity: Not on file  Stress: Not on file  Social Connections: Not on file  Depression (PHQ2-9): Low Risk (03/15/2024)   Depression (PHQ2-9)    PHQ-2 Score: 0  Alcohol Screen: Not on file  Housing: Not on file  Utilities: Not on file  Health Literacy: Not on file    Allergies: Allergies[1]  Metabolic  Disorder Labs: No results found for: HGBA1C, MPG No results found for: PROLACTIN Lab Results  Component Value Date   TRIG 37 05/13/2012   Lab Results  Component Value Date   TSH 1.162 05/12/2012    Therapeutic Level Labs: No results found for: LITHIUM No results found for: VALPROATE No results found for: CBMZ  Current Medications: Current Outpatient Medications  Medication Sig Dispense Refill   ALPRAZolam  (XANAX ) 1 MG tablet Take 1 tablet (1 mg total) by mouth 3 (three) times daily as needed for anxiety. 90 tablet 5   FLUoxetine  (PROZAC ) 20 MG capsule Take 1 capsule (20 mg total) by mouth daily. 30 capsule 5   levETIRAcetam  (KEPPRA ) 500 MG tablet Take 500 mg by mouth daily.     lisinopril (PRINIVIL,ZESTRIL) 40 MG tablet Take 40 mg by mouth daily.     Multiple Vitamin (MULTIVITAMIN WITH MINERALS) TABS Take 1 tablet by mouth daily.     mupirocin ointment (BACTROBAN) 2 % SMARTSIG:sparingly Topical 3 Times Daily     No current facility-administered medications for this visit.     Musculoskeletal: Strength & Muscle Tone: na Gait & Station: na Patient leans: N/A  Psychiatric Specialty Exam: Review of Systems  All other systems reviewed and are negative.   There were no vitals taken for this visit.There is no height or weight on file to calculate BMI.  General Appearance: NA  Eye Contact:  NA  Speech:  Clear and Coherent  Volume:  Normal  Mood:  Euthymic  Affect:  NA  Thought Process:  Goal Directed  Orientation:  Full (Time, Place, and Person)  Thought Content: WDL   Suicidal Thoughts:  No  Homicidal Thoughts:  No  Memory:  Immediate;   Good Recent;   Good Remote;   NA  Judgement:  Good  Insight:  Good  Psychomotor Activity:  Normal  Concentration:  Concentration: Good and Attention Span: Good  Recall:  Good  Fund of Knowledge: Good  Language: Good  Akathisia:  No  Handed:  Right  AIMS (if indicated): not done  Assets:  Communication  Skills Desire for Improvement Physical Health Resilience Social Support  ADL's:  Intact  Cognition: WNL  Sleep:  Good   Screenings: PHQ2-9    Flowsheet Row Office Visit from 03/15/2024 in [facility] Video Visit from 04/27/2022 in [facility] at [place] Video Visit from 12/03/2021 in [facility] at [place] Video Visit from 06/18/2021 in [facility] at [place] Video Visit from 01/06/2021 in [facility] at [facility] Total Score 0 0 0 0  0   Flowsheet Row Video Visit from 04/27/2022 in [facility] at [place] Video Visit from 12/03/2021 in [facility] at [place] Video Visit from 01/06/2021 in [facility] Outpatient Behavior[facility] at [place]  C-SSRS RISK CATEGORY No Risk No Risk No Risk     Assessment and Plan: This patient is a 74 year old female with history of major depression and generalized anxiety.  She continues to do well on her current regimen.  She will continue Xanax  1 mg 3 times daily for anxiety and Prozac  20 mg daily for major depression.  She will return to see me  Collaboration of Care: Collaboration of Care: Primary Care Provider AEB notes will be shared with PCP at patient's request  Patient/Guardian was advised Release of Information must be obtained prior to any record release in order to collaborate their care with an outside provider. Patient/Guardian was advised if they have not already done so to contact the registration department to sign all necessary forms in order for us  to release information regarding their care.   Consent: Patient/Guardian gives verbal consent for treatment and assignment of benefits for services provided during this visit. Patient/Guardian expressed understanding and agreed to proceed.    Barnie Gull, MD 09/14/2024, 11:14 AM     [1] No Known Allergies
